# Patient Record
Sex: Male | Born: 1965 | Hispanic: No | Marital: Single | Smoking: Never smoker
Health system: Southern US, Community
[De-identification: ages and names within clinical notes are randomized; demographics above are authoritative.]

## PROBLEM LIST (undated history)

## (undated) DIAGNOSIS — Z992 Dependence on renal dialysis: Secondary | ICD-10-CM

## (undated) DIAGNOSIS — N186 End stage renal disease: Secondary | ICD-10-CM

## (undated) DIAGNOSIS — I639 Cerebral infarction, unspecified: Secondary | ICD-10-CM

## (undated) DIAGNOSIS — I5020 Unspecified systolic (congestive) heart failure: Secondary | ICD-10-CM

## (undated) DIAGNOSIS — I10 Essential (primary) hypertension: Secondary | ICD-10-CM

## (undated) DIAGNOSIS — E785 Hyperlipidemia, unspecified: Secondary | ICD-10-CM

## (undated) DIAGNOSIS — N189 Chronic kidney disease, unspecified: Secondary | ICD-10-CM

## (undated) HISTORY — DX: Hyperlipidemia, unspecified: E78.5

## (undated) HISTORY — DX: End stage renal disease: N18.6

## (undated) HISTORY — DX: Unspecified systolic (congestive) heart failure: I50.20

## (undated) HISTORY — DX: Dependence on renal dialysis: Z99.2

## (undated) HISTORY — DX: Essential (primary) hypertension: I10

## (undated) HISTORY — DX: Cerebral infarction, unspecified: I63.9

## (undated) HISTORY — DX: Chronic kidney disease, unspecified: N18.9

---

## 2021-12-17 ENCOUNTER — Emergency Department (HOSPITAL_COMMUNITY): Payer: Medicaid - Out of State

## 2021-12-17 ENCOUNTER — Other Ambulatory Visit: Payer: Self-pay

## 2021-12-17 ENCOUNTER — Emergency Department (HOSPITAL_COMMUNITY)
Admission: EM | Admit: 2021-12-17 | Discharge: 2021-12-17 | Disposition: A | Discharge status: Home / Self Care | Payer: Medicaid - Out of State | Attending: Emergency Medicine | Admitting: Emergency Medicine

## 2021-12-17 DIAGNOSIS — T82898A Other specified complication of vascular prosthetic devices, implants and grafts, initial encounter: Secondary | ICD-10-CM | POA: Diagnosis present

## 2021-12-17 DIAGNOSIS — R61 Generalized hyperhidrosis: Secondary | ICD-10-CM | POA: Diagnosis not present

## 2021-12-17 DIAGNOSIS — Z992 Dependence on renal dialysis: Secondary | ICD-10-CM | POA: Insufficient documentation

## 2021-12-17 DIAGNOSIS — E875 Hyperkalemia: Secondary | ICD-10-CM | POA: Diagnosis not present

## 2021-12-17 DIAGNOSIS — Y841 Kidney dialysis as the cause of abnormal reaction of the patient, or of later complication, without mention of misadventure at the time of the procedure: Secondary | ICD-10-CM | POA: Insufficient documentation

## 2021-12-17 DIAGNOSIS — N186 End stage renal disease: Secondary | ICD-10-CM | POA: Insufficient documentation

## 2021-12-17 HISTORY — PX: IR FLUORO GUIDE CV LINE RIGHT: IMG2283

## 2021-12-17 LAB — CBC WITH DIFFERENTIAL/PLATELET
Abs Immature Granulocytes: 0.03 10*3/uL (ref 0.00–0.07)
Basophils Absolute: 0.1 10*3/uL (ref 0.0–0.1)
Basophils Relative: 1 %
Eosinophils Absolute: 0.3 10*3/uL (ref 0.0–0.5)
Eosinophils Relative: 3 %
HCT: 34.7 % — ABNORMAL LOW (ref 39.0–52.0)
Hemoglobin: 11.3 g/dL — ABNORMAL LOW (ref 13.0–17.0)
Immature Granulocytes: 0 %
Lymphocytes Relative: 13 %
Lymphs Abs: 1.1 10*3/uL (ref 0.7–4.0)
MCH: 32.2 pg (ref 26.0–34.0)
MCHC: 32.6 g/dL (ref 30.0–36.0)
MCV: 98.9 fL (ref 80.0–100.0)
Monocytes Absolute: 0.3 10*3/uL (ref 0.1–1.0)
Monocytes Relative: 4 %
Neutro Abs: 6.6 10*3/uL (ref 1.7–7.7)
Neutrophils Relative %: 79 %
Platelets: 181 10*3/uL (ref 150–400)
RBC: 3.51 MIL/uL — ABNORMAL LOW (ref 4.22–5.81)
RDW: 12.1 % (ref 11.5–15.5)
WBC: 8.4 10*3/uL (ref 4.0–10.5)
nRBC: 0 % (ref 0.0–0.2)

## 2021-12-17 LAB — COMPREHENSIVE METABOLIC PANEL
ALT: 11 U/L (ref 0–44)
AST: 11 U/L — ABNORMAL LOW (ref 15–41)
Albumin: 4.1 g/dL (ref 3.5–5.0)
Alkaline Phosphatase: 50 U/L (ref 38–126)
Anion gap: 14 (ref 5–15)
BUN: 99 mg/dL — ABNORMAL HIGH (ref 6–20)
CO2: 19 mmol/L — ABNORMAL LOW (ref 22–32)
Calcium: 9 mg/dL (ref 8.9–10.3)
Chloride: 107 mmol/L (ref 98–111)
Creatinine, Ser: 14.89 mg/dL — ABNORMAL HIGH (ref 0.61–1.24)
GFR, Estimated: 3 mL/min — ABNORMAL LOW (ref >60)
Glucose, Bld: 103 mg/dL — ABNORMAL HIGH (ref 70–99)
Potassium: 5.3 mmol/L — ABNORMAL HIGH (ref 3.5–5.1)
Sodium: 140 mmol/L (ref 135–145)
Total Bilirubin: 0.7 mg/dL (ref 0.3–1.2)
Total Protein: 6.9 g/dL (ref 6.5–8.1)

## 2021-12-17 LAB — MAGNESIUM: Magnesium: 2.6 mg/dL — ABNORMAL HIGH (ref 1.7–2.4)

## 2021-12-17 MED ORDER — HEPARIN SODIUM (PORCINE) 1000 UNIT/ML IJ SOLN
INTRAMUSCULAR | Status: AC
  Filled 2021-12-17: qty 10

## 2021-12-17 MED ORDER — LIDOCAINE HCL (PF) 1 % IJ SOLN
INTRAMUSCULAR | Status: DC | PRN
Start: 2021-12-17 — End: 2021-12-17
  Administered 2021-12-17: 10 mL

## 2021-12-17 MED ORDER — CEFAZOLIN SODIUM-DEXTROSE 2-4 GM/100ML-% IV SOLN
2.0000 g | Freq: Once | INTRAVENOUS | Status: AC
  Administered 2021-12-17: 2 g via INTRAVENOUS
  Filled 2021-12-17: qty 100

## 2021-12-17 MED ORDER — LOKELMA 10 G PO PACK: 10.0000 g | PACK | Freq: Every day | ORAL | 0 refills | Status: DC

## 2021-12-17 MED ORDER — LIDOCAINE HCL 1 % IJ SOLN
INTRAMUSCULAR | Status: AC
  Filled 2021-12-17: qty 20

## 2021-12-17 MED ORDER — SODIUM ZIRCONIUM CYCLOSILICATE 10 G PO PACK
10.0000 g | PACK | Freq: Once | ORAL | Status: AC
  Administered 2021-12-17: 10 g via ORAL
  Filled 2021-12-17: qty 1

## 2021-12-17 NOTE — ED Notes (Signed)
Patient transported to IR 

## 2021-12-17 NOTE — ED Provider Notes (Incomplete)
°  [facility] EMERGENCY DEPARTMENT Provider Note   CSN: [id number] Arrival date & time: 12/17/21  1128     History {Add pertinent medical, surgical, social history, OB history to HPI:1} Chief Complaint  Patient presents with   Vascular Access Problem    Dialysis Cath    Scott Crosby is a 56 y.o. male.  HPI Patient presenting for concern for dialysis catheter.  He was told on Tuesday that he will need replacement.  He has missed Thursday and Saturday sessions of HD. Moved from [place] a month ago. Established w/ [facility].    Home Medications Prior to Admission medications   Not on File      Allergies    Patient has no known allergies.    Review of Systems   Review of Systems  Physical Exam Updated Vital Signs BP (!) 194/121 (BP Location: Right Arm)    Pulse 94    Temp 98 F (36.7 C) (Oral)    Resp 16    SpO2 100%  Physical Exam  ED Results / Procedures / Treatments   Labs (all labs ordered are listed, but only abnormal results are displayed) Labs Reviewed  COMPREHENSIVE METABOLIC PANEL - Abnormal; Notable for the following components:      Result Value   Potassium 5.3 (*)    CO2 19 (*)    Glucose, Bld 103 (*)    BUN 99 (*)    Creatinine, Ser 14.89 (*)    AST 11 (*)    GFR, Estimated 3 (*)    All other components within normal limits  CBC WITH DIFFERENTIAL/PLATELET - Abnormal; Notable for the following components:   RBC 3.51 (*)    Hemoglobin 11.3 (*)    HCT 34.7 (*)    All other components within normal limits  MAGNESIUM - Abnormal; Notable for the following components:   Magnesium 2.6 (*)    All other components within normal limits    EKG None  Radiology DG Chest 1 View  Result Date: 12/17/2021 CLINICAL DATA:  Missed dialysis EXAM: CHEST  1 VIEW COMPARISON:  None. FINDINGS: The heart size and mediastinal contours are within normal limits. Right neck large bore multi lumen vascular catheter, tips projecting near the  confluence of the superior vena cava. Both lungs are clear. The visualized skeletal structures are unremarkable. IMPRESSION: 1. No acute abnormality of the lungs. 2. Right neck large bore multi lumen vascular catheter, tips projecting near the confluence of the superior vena cava. Electronically Signed   By: Delanna Ahmadi M.D.   On: 12/17/2021 12:20    Procedures Procedures  {Document cardiac monitor, telemetry assessment procedure when appropriate:1}  Medications Ordered in ED Medications - No data to display  ED Course/ Medical Decision Making/ A&P                           Medical Decision Making  ***  {Document critical care time when appropriate:1} {Document review of labs and clinical decision tools ie heart score, Chads2Vasc2 etc:1}  {Document your independent review of radiology images, and any outside records:1} {Document your discussion with family members, caretakers, and with consultants:1} {Document social determinants of health affecting pt's care:1} {Document your decision making why or why not admission, treatments were needed:1} Final Clinical Impression(s) / ED Diagnoses Final diagnoses:  None    Rx / DC Orders ED Discharge Orders     None

## 2021-12-17 NOTE — Discharge Instructions (Addendum)
Return for any problem.  ? ?Take Lokelma (10 grams) once by mouth tomorrow morning prior to outpatient dialysis session.  ?

## 2021-12-17 NOTE — ED Triage Notes (Signed)
Pt here for problem with dialysis catheter. Pt has cath in R chest, dialysis staff told him they think it is "falling out", pt has missed last 2 sessions due to this. Pt usually goes Tue, Thurs, and Sat. Pt last went on Tuesday, has been limiting PO intake, denies pain. ?

## 2021-12-17 NOTE — ED Provider Triage Note (Signed)
Emergency Medicine Provider Triage Evaluation Note ? ?Scott Crosby , a 56 y.o. male  was evaluated in triage.  Pt states that his catheter for dialysis is out of place.  He was told that he needed a new 1 placed during his dialysis session on Tuesday.  He has not been able to do this.  Has not had dialysis since Tuesday, missed his Thursday and Saturday sessions.  Says that he has been limiting his fluid intake and has not had any symptoms of missed dialysis. ?Review of Systems  ?Positive: As above.  No symptoms ?Negative: Difficulty breathing, ascites, leg inflammation, chest pain ? ?Physical Exam  ?BP (!) 194/121 (BP Location: Right Arm)   Pulse 94   Temp 98 ?F (36.7 ?C) (Oral)   Resp 16   SpO2 100%  ?Gen:   Awake, no distress   ?Resp:  Normal effort  ?MSK:   Moves extremities without difficulty  ?Other:  Catheter appears to be in place, has not fallen out.  No signs of infection.  Patient well-appearing ? ?Medical Decision Making  ?Medically screening exam initiated at 11:42 AM.  Appropriate orders placed.  Arcangel Minion was informed that the remainder of the evaluation will be completed by another provider, this initial triage assessment does not replace that evaluation, and the importance of remaining in the ED until their evaluation is complete. ? ? ?  ?Rhae Hammock, PA-C ?12/17/21 1144 ? ?

## 2021-12-17 NOTE — Procedures (Signed)
Pre-procedure Diagnosis: ESRD ?Post-procedure Diagnosis: Same ? ?Successful replacement of tunneled HD catheter with tips terminating within the superior aspect of the right atrium.   ? ?Complications: None Immediate ?EBL: Trace  ? ?The catheter is ready for immediate use.  ? ?Ronny Bacon, MD ?Pager #: [phone] ? ? ?

## 2021-12-17 NOTE — ED Provider Notes (Signed)
Patient seen after prior EDP. ? ?Catheter changed without difficulty by IR. ? ?Patient with plan for outpatient dialysis tomorrow ? ?Patient is appropriate for discharge. ?  ?Valarie Merino, MD ?12/17/21 1732 ? ?

## 2021-12-18 ENCOUNTER — Other Ambulatory Visit (HOSPITAL_COMMUNITY): Payer: Self-pay | Admitting: Nephrology

## 2021-12-27 ENCOUNTER — Other Ambulatory Visit: Payer: Self-pay

## 2021-12-27 DIAGNOSIS — N189 Chronic kidney disease, unspecified: Secondary | ICD-10-CM

## 2021-12-31 ENCOUNTER — Other Ambulatory Visit: Payer: Self-pay

## 2021-12-31 ENCOUNTER — Ambulatory Visit (INDEPENDENT_AMBULATORY_CARE_PROVIDER_SITE_OTHER)
Admission: RE | Admit: 2021-12-31 | Discharge: 2021-12-31 | Disposition: A | Discharge status: Home / Self Care | Payer: Medicare Other | Source: Ambulatory Visit | Attending: Vascular Surgery | Admitting: Vascular Surgery

## 2021-12-31 ENCOUNTER — Ambulatory Visit (HOSPITAL_COMMUNITY)
Admission: RE | Admit: 2021-12-31 | Discharge: 2021-12-31 | Disposition: A | Discharge status: Home / Self Care | Payer: Medicare Other | Source: Ambulatory Visit | Attending: Vascular Surgery | Admitting: Vascular Surgery

## 2021-12-31 DIAGNOSIS — N189 Chronic kidney disease, unspecified: Secondary | ICD-10-CM

## 2022-01-09 ENCOUNTER — Encounter: Payer: Medicaid - Out of State | Admitting: Vascular Surgery

## 2022-01-22 ENCOUNTER — Encounter: Payer: Self-pay | Admitting: Vascular Surgery

## 2022-01-22 ENCOUNTER — Ambulatory Visit (INDEPENDENT_AMBULATORY_CARE_PROVIDER_SITE_OTHER): Payer: Medicaid Other | Admitting: Vascular Surgery

## 2022-01-22 VITALS — BP 159/109 | HR 108 | Temp 101.1°F | Resp 20

## 2022-01-22 DIAGNOSIS — N186 End stage renal disease: Secondary | ICD-10-CM

## 2022-01-22 DIAGNOSIS — Z992 Dependence on renal dialysis: Secondary | ICD-10-CM | POA: Diagnosis not present

## 2022-01-22 NOTE — Progress Notes (Signed)
[facility]  ? ?ASSESSMENT / PLAN: ?Scott Crosby is a 56 y.o. right handed male in need of permanent dialysis access. I reviewed options for dialysis in detail with the patient, including hemodialysis and peritoneal dialysis. I counseled the patient to ask their nephrologist about their candidacy for renal transplant. I counseled the patient that dialysis access requires surveillance and periodic maintenance. Plan to proceed with left brachiobasilic arteriovenous fistula as OR schedule allows. ? ?CHIEF COMPLAINT: ESRD in need of dialysis ? ?HISTORY OF PRESENT ILLNESS: ?Scott Crosby is a 56 y.o. male with a known history of end-stage renal disease on dialysis radiate previously dialyzed through a left upper extremity Cimino fistula created in [place].  This is thrombosed.  He now has a right IJ tunneled dialysis catheter.  He is right-handed. ? ?Past Medical History:  ?Diagnosis Date  ? Chronic kidney disease   ? Hyperlipidemia   ? Hypertension   ? ? ?Past Surgical History:  ?Procedure Laterality Date  ? IR FLUORO GUIDE CV LINE RIGHT  12/17/2021  ? ? ?History reviewed. No pertinent family history. ? ?Social History  ? ?Socioeconomic History  ? Marital status: Single  ?  Spouse name: Not on file  ? Number of children: Not on file  ? Years of education: Not on file  ? Highest education level: Not on file  ?Occupational History  ? Not on file  ?Tobacco Use  ? Smoking status: Never  ? Smokeless tobacco: Never  ?Vaping Use  ? Vaping Use: Never used  ?Substance and Sexual Activity  ? Alcohol use: Not on file  ? Drug use: Not Currently  ? Sexual activity: Not on file  ?Other Topics Concern  ? Not on file  ?Social History Narrative  ? Not on file  ? ?Social Determinants of Health  ? ?Financial Resource Strain: Not on file  ?Food Insecurity: Not on file  ?Transportation Needs: Not on file  ?Physical Activity: Not on file  ?Stress: Not on file  ?Social Connections: Not on file  ?Intimate  Partner Violence: Not on file  ? ? ?No Known Allergies ? ?Current Outpatient Medications  ?Medication Sig Dispense Refill  ? atorvastatin (LIPITOR) 10 MG tablet Take 10 mg by mouth daily.    ? B Complex-C-Folic Acid (B-COMPLEX/FOLIC ACID/VITAMIN C) TBCR Take 1 tablet by mouth daily.    ? calcium acetate (PHOSLO) 667 MG tablet Take by mouth.    ? losartan (COZAAR) 100 MG tablet Take 100 mg by mouth daily.    ? sodium zirconium cyclosilicate (LOKELMA) 10 g PACK packet Take 10 g by mouth daily. 1 packet 0  ? ?No current facility-administered medications for this visit.  ? ? ?PHYSICAL EXAM ?Vitals:  ? 01/22/22 1545  ?BP: (!) 159/109  ?Pulse: (!) 108  ?Resp: 20  ?Temp: (!) 101.1 ?F (38.4 ?C)  ?SpO2: 100%  ? ? ?Constitutional: Chronically ill-appearing male in no acute distress. ?Cardiac: regular rate and rhythm.  ?Respiratory:  unlabored. ?Abdominal:  soft, non-tender, non-distended.  ?Peripheral vascular: Scarring consistent with left upper extremity Cimino fistula.  This is thrombosed.  He has a 2+ radial pulse and a 2+ brachial pulse on the left. ? ?PERTINENT LABORATORY AND RADIOLOGIC DATA ? ?Most recent CBC ? ?  Latest Ref Rng & Units 12/17/2021  ? 11:45 AM  ?CBC  ?WBC 4.0 - 10.5 K/uL 8.4    ?Hemoglobin 13.0 - 17.0 g/dL 11.3    ?Hematocrit 39.0 - 52.0 % 34.7    ?Platelets  150 - 400 K/uL 181    ?  ? ?Most recent CMP ? ?  Latest Ref Rng & Units 12/17/2021  ? 11:45 AM  ?CMP  ?Glucose 70 - 99 mg/dL 103    ?BUN 6 - 20 mg/dL 99    ?Creatinine 0.61 - 1.24 mg/dL 14.89    ?Sodium 135 - 145 mmol/L 140    ?Potassium 3.5 - 5.1 mmol/L 5.3    ?Chloride 98 - 111 mmol/L 107    ?CO2 22 - 32 mmol/L 19    ?Calcium 8.9 - 10.3 mg/dL 9.0    ?Total Protein 6.5 - 8.1 g/dL 6.9    ?Total Bilirubin 0.3 - 1.2 mg/dL 0.7    ?Alkaline Phos 38 - 126 U/L 50    ?AST 15 - 41 U/L 11    ?ALT 0 - 44 U/L 11    ? ?Venous duplex shows generous left iliac vein suitable for fistula creation. ? ?Yevonne Aline. Stanford Breed, MD ?[facility]  [place] ?Office Phone Number: [phone] ?01/22/2022 5:07 PM ? ?Total time spent on preparing this encounter including chart review, data review, collecting history, examining the patient, coordinating care for this new patient, 60 minutes. ? ?Portions of this report may have been transcribed using voice recognition software.  Every effort has been made to ensure accuracy; however, inadvertent computerized transcription errors may still be present. ? ? ? ?

## 2022-01-22 NOTE — H&P (View-Only) (Signed)
[facility] ? ?ASSESSMENT / PLAN: ?Scott Crosby is a 56 y.o. right handed male in need of permanent dialysis access. I reviewed options for dialysis in detail with the patient, including hemodialysis and peritoneal dialysis. I counseled the patient to ask their nephrologist about their candidacy for renal transplant. I counseled the patient that dialysis access requires surveillance and periodic maintenance. Plan to proceed with left brachiobasilic arteriovenous fistula as OR schedule allows. ? ?CHIEF COMPLAINT: ESRD in need of dialysis ? ?HISTORY OF PRESENT ILLNESS: ?Scott Crosby is a 56 y.o. male with a known history of end-stage renal disease on dialysis radiate previously dialyzed through a left upper extremity Cimino fistula created in [place].  This is thrombosed.  He now has a right IJ tunneled dialysis catheter.  He is right-handed. ? ?Past Medical History:  ?Diagnosis Date  ? Chronic kidney disease   ? Hyperlipidemia   ? Hypertension   ? ? ?Past Surgical History:  ?Procedure Laterality Date  ? IR FLUORO GUIDE CV LINE RIGHT  12/17/2021  ? ? ?History reviewed. No pertinent family history. ? ?Social History  ? ?Socioeconomic History  ? Marital status: Single  ?  Spouse name: Not on file  ? Number of children: Not on file  ? Years of education: Not on file  ? Highest education level: Not on file  ?Occupational History  ? Not on file  ?Tobacco Use  ? Smoking status: Never  ? Smokeless tobacco: Never  ?Vaping Use  ? Vaping Use: Never used  ?Substance and Sexual Activity  ? Alcohol use: Not on file  ? Drug use: Not Currently  ? Sexual activity: Not on file  ?Other Topics Concern  ? Not on file  ?Social History Narrative  ? Not on file  ? ?Social Determinants of Health  ? ?Financial Resource Strain: Not on file  ?Food Insecurity: Not on file  ?Transportation Needs: Not on file  ?Physical Activity: Not on file  ?Stress: Not on file  ?Social Connections: Not on file  ?Intimate  Partner Violence: Not on file  ? ? ?No Known Allergies ? ?Current Outpatient Medications  ?Medication Sig Dispense Refill  ? atorvastatin (LIPITOR) 10 MG tablet Take 10 mg by mouth daily.    ? B Complex-C-Folic Acid (B-COMPLEX/FOLIC ACID/VITAMIN C) TBCR Take 1 tablet by mouth daily.    ? calcium acetate (PHOSLO) 667 MG tablet Take by mouth.    ? losartan (COZAAR) 100 MG tablet Take 100 mg by mouth daily.    ? sodium zirconium cyclosilicate (LOKELMA) 10 g PACK packet Take 10 g by mouth daily. 1 packet 0  ? ?No current facility-administered medications for this visit.  ? ? ?PHYSICAL EXAM ?Vitals:  ? 01/22/22 1545  ?BP: (!) 159/109  ?Pulse: (!) 108  ?Resp: 20  ?Temp: (!) 101.1 ?F (38.4 ?C)  ?SpO2: 100%  ? ? ?Constitutional: Chronically ill-appearing male in no acute distress. ?Cardiac: regular rate and rhythm.  ?Respiratory:  unlabored. ?Abdominal:  soft, non-tender, non-distended.  ?Peripheral vascular: Scarring consistent with left upper extremity Cimino fistula.  This is thrombosed.  He has a 2+ radial pulse and a 2+ brachial pulse on the left. ? ?PERTINENT LABORATORY AND RADIOLOGIC DATA ? ?Most recent CBC ? ?  Latest Ref Rng & Units 12/17/2021  ? 11:45 AM  ?CBC  ?WBC 4.0 - 10.5 K/uL 8.4    ?Hemoglobin 13.0 - 17.0 g/dL 11.3    ?Hematocrit 39.0 - 52.0 % 34.7    ?Platelets  150 - 400 K/uL 181    ?  ? ?Most recent CMP ? ?  Latest Ref Rng & Units 12/17/2021  ? 11:45 AM  ?CMP  ?Glucose 70 - 99 mg/dL 103    ?BUN 6 - 20 mg/dL 99    ?Creatinine 0.61 - 1.24 mg/dL 14.89    ?Sodium 135 - 145 mmol/L 140    ?Potassium 3.5 - 5.1 mmol/L 5.3    ?Chloride 98 - 111 mmol/L 107    ?CO2 22 - 32 mmol/L 19    ?Calcium 8.9 - 10.3 mg/dL 9.0    ?Total Protein 6.5 - 8.1 g/dL 6.9    ?Total Bilirubin 0.3 - 1.2 mg/dL 0.7    ?Alkaline Phos 38 - 126 U/L 50    ?AST 15 - 41 U/L 11    ?ALT 0 - 44 U/L 11    ? ?Venous duplex shows generous left iliac vein suitable for fistula creation. ? ?Yevonne Aline. Stanford Breed, MD ?[facility]  [place] ?Office Phone Number: [phone] ?01/22/2022 5:07 PM ? ?Total time spent on preparing this encounter including chart review, data review, collecting history, examining the patient, coordinating care for this new patient, 60 minutes. ? ?Portions of this report may have been transcribed using voice recognition software.  Every effort has been made to ensure accuracy; however, inadvertent computerized transcription errors may still be present. ? ? ? ?

## 2022-01-25 ENCOUNTER — Other Ambulatory Visit: Payer: Self-pay

## 2022-02-01 ENCOUNTER — Other Ambulatory Visit: Payer: Self-pay

## 2022-02-01 ENCOUNTER — Encounter (HOSPITAL_COMMUNITY): Payer: Self-pay | Admitting: Vascular Surgery

## 2022-02-01 NOTE — Progress Notes (Signed)
PCP - denies ?EKG - DOS ?Chest x-ray - 12/17/21 (1 view) ? ?------------- ? ?SDW INSTRUCTIONS: ? ?Your procedure is scheduled on Monday, May 1st. Please report to [facility] Main Entrance "A" at 5:30 A.M., and check in at the Admitting office. Call this number if you have problems the morning of surgery: [phone] ? ? ?Remember: Do not eat or drink after midnight the night before your surgery ? ?  ?Medications to take morning of surgery with a sip of water include: ?Atorvastatin (Lipitor) ? ? ?As of today, STOP taking any Aspirin (unless otherwise instructed by your surgeon), Aleve, Naproxen, Ibuprofen, Motrin, Advil, Goody's, BC's, all herbal medications, fish oil, and all vitamins. ? ?  ?The Morning of Surgery ?Do not wear jewelry ?Do not wear lotions, powders, colognes, or deodorant ?Do not bring valuables to the hospital. ?[facility] is not responsible for any belongings or valuables. ? ?If you are a smoker, DO NOT Smoke 24 hours prior to surgery ? ?If you wear a CPAP at night please bring your mask the morning of surgery  ? ?Remember that you must have someone to transport you home after your surgery, and remain with you for 24 hours if you are discharged the same day. ? ?Please bring cases for contacts, glasses, hearing aids, dentures or bridgework because it cannot be worn into surgery.  ? ?Patients discharged the day of surgery will not be allowed to drive home.  ? ?Please shower the NIGHT BEFORE/MORNING OF SURGERY (use antibacterial soap like DIAL soap if possible). Wear comfortable clothes the morning of surgery. Oral Hygiene is also important to reduce your risk of infection.  Remember - BRUSH YOUR TEETH THE MORNING OF SURGERY WITH YOUR REGULAR TOOTHPASTE ? ?Patient denies shortness of breath, fever, cough and chest pain.  ? ? ?   ? ?

## 2022-02-03 NOTE — Anesthesia Preprocedure Evaluation (Addendum)
Anesthesia Evaluation  ?Patient identified by MRN, date of birth, ID band ?Patient awake ? ? ? ?Reviewed: ?Allergy & Precautions, NPO status , Patient's Chart, lab work & pertinent test results ? ?Airway ?Mallampati: II ? ?TM Distance: >3 FB ?Neck ROM: Full ? ? ? Dental ? ?(+) Poor Dentition ?  ?Pulmonary ?neg pulmonary ROS,  ?  ?Pulmonary exam normal ? ? ? ? ? ? ? Cardiovascular ?hypertension, Pt. on medications ? ?Rhythm:Regular Rate:Normal ? ? ?  ?Neuro/Psych ?negative neurological ROS ? negative psych ROS  ? GI/Hepatic ?negative GI ROS, Neg liver ROS,   ?Endo/Other  ?negative endocrine ROS ? Renal/GU ?ESRF and DialysisRenal disease  ?negative genitourinary ?  ?Musculoskeletal ?negative musculoskeletal ROS ?(+)  ? Abdominal ?Normal abdominal exam  (+)   ?Peds ? Hematology ?negative hematology ROS ?(+)   ?Anesthesia Other Findings ? ? Reproductive/Obstetrics ? ?  ? ? ? ? ? ? ? ? ? ? ? ? ? ?  ?  ? ? ? ? ? ? ? ?Anesthesia Physical ?Anesthesia Plan ? ?ASA: 3 ? ?Anesthesia Plan: MAC and Regional  ? ?Post-op Pain Management: Regional block*  ? ?Induction: Intravenous ? ?PONV Risk Score and Plan: 1 and Ondansetron, Midazolam, Propofol infusion, Dexamethasone and Treatment may vary due to age or medical condition ? ?Airway Management Planned: Simple Face Mask, Natural Airway and Nasal Cannula ? ?Additional Equipment: None ? ?Intra-op Plan:  ? ?Post-operative Plan:  ? ?Informed Consent: I have reviewed the patients History and Physical, chart, labs and discussed the procedure including the risks, benefits and alternatives for the proposed anesthesia with the patient or authorized representative who has indicated his/her understanding and acceptance.  ? ? ? ?Dental advisory given ? ?Plan Discussed with: CRNA ? ?Anesthesia Plan Comments: (Lab Results ?     Component                Value               Date                 ?     NA                       135                 02/04/2022            ?     K                        5.0                 02/04/2022           ?     CO2                      19 (L)              12/17/2021           ?     GLUCOSE                  81                  02/04/2022           ?     BUN  53 (H)              02/04/2022           ?     CREATININE               10.00 (H)           02/04/2022           ?     CALCIUM                  9.0                 12/17/2021           ?     GFRNONAA                 3 (L)               12/17/2021           ?Lab Results ?     Component                Value               Date                 ?     WBC                      8.4                 12/17/2021           ?     HGB                      10.5 (L)            02/04/2022           ?     HCT                      31.0 (L)            02/04/2022           ?     MCV                      98.9                12/17/2021           ?     PLT                      181                 12/17/2021          )  ? ? ? ? ? ?Anesthesia Quick Evaluation ? ?

## 2022-02-04 ENCOUNTER — Ambulatory Visit (HOSPITAL_BASED_OUTPATIENT_CLINIC_OR_DEPARTMENT_OTHER): Payer: Medicare Other | Admitting: Anesthesiology

## 2022-02-04 ENCOUNTER — Ambulatory Visit (HOSPITAL_COMMUNITY)
Admission: RE | Admit: 2022-02-04 | Discharge: 2022-02-04 | Disposition: A | Discharge status: Home / Self Care | Payer: Medicare Other | Attending: Vascular Surgery | Admitting: Vascular Surgery

## 2022-02-04 ENCOUNTER — Other Ambulatory Visit: Payer: Self-pay

## 2022-02-04 ENCOUNTER — Encounter (HOSPITAL_COMMUNITY)
Admission: RE | Disposition: A | Discharge status: Home / Self Care | Payer: Self-pay | Source: Home / Self Care | Attending: Vascular Surgery

## 2022-02-04 ENCOUNTER — Ambulatory Visit (HOSPITAL_COMMUNITY): Payer: Medicare Other | Admitting: Anesthesiology

## 2022-02-04 DIAGNOSIS — N186 End stage renal disease: Secondary | ICD-10-CM | POA: Insufficient documentation

## 2022-02-04 DIAGNOSIS — Z992 Dependence on renal dialysis: Secondary | ICD-10-CM | POA: Insufficient documentation

## 2022-02-04 DIAGNOSIS — I120 Hypertensive chronic kidney disease with stage 5 chronic kidney disease or end stage renal disease: Secondary | ICD-10-CM

## 2022-02-04 DIAGNOSIS — N185 Chronic kidney disease, stage 5: Secondary | ICD-10-CM | POA: Diagnosis not present

## 2022-02-04 HISTORY — PX: AV FISTULA PLACEMENT: SHX1204

## 2022-02-04 LAB — POCT I-STAT, CHEM 8
BUN: 53 mg/dL — ABNORMAL HIGH (ref 6–20)
Calcium, Ion: 1.26 mmol/L (ref 1.15–1.40)
Chloride: 98 mmol/L (ref 98–111)
Creatinine, Ser: 10 mg/dL — ABNORMAL HIGH (ref 0.61–1.24)
Glucose, Bld: 81 mg/dL (ref 70–99)
HCT: 31 % — ABNORMAL LOW (ref 39.0–52.0)
Hemoglobin: 10.5 g/dL — ABNORMAL LOW (ref 13.0–17.0)
Potassium: 5 mmol/L (ref 3.5–5.1)
Sodium: 135 mmol/L (ref 135–145)
TCO2: 28 mmol/L (ref 22–32)

## 2022-02-04 SURGERY — ARTERIOVENOUS (AV) FISTULA CREATION
Anesthesia: Monitor Anesthesia Care | Site: Arm Upper | Laterality: Left

## 2022-02-04 MED ORDER — CHLORHEXIDINE GLUCONATE 0.12 % MT SOLN
OROMUCOSAL | Status: AC
  Administered 2022-02-04: 15 mL via OROMUCOSAL
  Filled 2022-02-04: qty 15

## 2022-02-04 MED ORDER — FENTANYL CITRATE (PF) 250 MCG/5ML IJ SOLN
INTRAMUSCULAR | Status: DC | PRN
Start: 2022-02-04 — End: 2022-02-04
  Administered 2022-02-04: 50 ug via INTRAVENOUS

## 2022-02-04 MED ORDER — FENTANYL CITRATE (PF) 100 MCG/2ML IJ SOLN
INTRAMUSCULAR | Status: AC
  Filled 2022-02-04: qty 2

## 2022-02-04 MED ORDER — CEFAZOLIN SODIUM-DEXTROSE 2-4 GM/100ML-% IV SOLN
INTRAVENOUS | Status: AC
  Filled 2022-02-04: qty 100

## 2022-02-04 MED ORDER — ORAL CARE MOUTH RINSE: 15.0000 mL | Freq: Once | OROMUCOSAL | Status: AC

## 2022-02-04 MED ORDER — HEPARIN 6000 UNIT IRRIGATION SOLUTION
Status: DC | PRN
  Administered 2022-02-04: 1

## 2022-02-04 MED ORDER — FENTANYL CITRATE (PF) 250 MCG/5ML IJ SOLN
INTRAMUSCULAR | Status: AC
  Filled 2022-02-04: qty 5

## 2022-02-04 MED ORDER — SODIUM CHLORIDE 0.9 % IV SOLN: INTRAVENOUS | Status: DC

## 2022-02-04 MED ORDER — MIDAZOLAM HCL 2 MG/2ML IJ SOLN
INTRAMUSCULAR | Status: AC
  Filled 2022-02-04: qty 2

## 2022-02-04 MED ORDER — HEPARIN 6000 UNIT IRRIGATION SOLUTION
Status: AC
  Filled 2022-02-04: qty 500

## 2022-02-04 MED ORDER — 0.9 % SODIUM CHLORIDE (POUR BTL) OPTIME
TOPICAL | Status: DC | PRN
  Administered 2022-02-04: 1000 mL

## 2022-02-04 MED ORDER — FENTANYL CITRATE (PF) 100 MCG/2ML IJ SOLN: 25.0000 ug | INTRAMUSCULAR | Status: DC | PRN

## 2022-02-04 MED ORDER — ROPIVACAINE HCL 5 MG/ML IJ SOLN
INTRAMUSCULAR | Status: DC | PRN
  Administered 2022-02-04: 10 mL

## 2022-02-04 MED ORDER — CEFAZOLIN SODIUM-DEXTROSE 2-4 GM/100ML-% IV SOLN
2.0000 g | INTRAVENOUS | Status: AC
  Administered 2022-02-04: 2 g via INTRAVENOUS

## 2022-02-04 MED ORDER — CHLORHEXIDINE GLUCONATE 4 % EX LIQD: 60.0000 mL | Freq: Once | CUTANEOUS | Status: DC

## 2022-02-04 MED ORDER — MEPIVACAINE HCL (PF) 2 % IJ SOLN
INTRAMUSCULAR | Status: DC | PRN
  Administered 2022-02-04: 20 mL

## 2022-02-04 MED ORDER — HYDROCODONE-ACETAMINOPHEN 5-325 MG PO TABS: 1.0000 | ORAL_TABLET | Freq: Four times a day (QID) | ORAL | 0 refills | Status: DC | PRN

## 2022-02-04 MED ORDER — CHLORHEXIDINE GLUCONATE 0.12 % MT SOLN: 15.0000 mL | Freq: Once | OROMUCOSAL | Status: AC

## 2022-02-04 MED ORDER — MIDAZOLAM HCL 2 MG/2ML IJ SOLN
INTRAMUSCULAR | Status: DC | PRN
Start: 2022-02-04 — End: 2022-02-04
  Administered 2022-02-04: 2 mg via INTRAVENOUS

## 2022-02-04 MED ORDER — HEPARIN SODIUM (PORCINE) 1000 UNIT/ML IJ SOLN
INTRAMUSCULAR | Status: DC | PRN
  Administered 2022-02-04: 5000 [IU] via INTRAVENOUS

## 2022-02-04 MED ORDER — PROPOFOL 500 MG/50ML IV EMUL
INTRAVENOUS | Status: DC | PRN
  Administered 2022-02-04: 50 ug/kg/min via INTRAVENOUS

## 2022-02-04 MED ORDER — ACETAMINOPHEN 10 MG/ML IV SOLN: 1000.0000 mg | Freq: Once | INTRAVENOUS | Status: DC | PRN

## 2022-02-04 MED ORDER — ONDANSETRON HCL 4 MG/2ML IJ SOLN
INTRAMUSCULAR | Status: DC | PRN
  Administered 2022-02-04: 4 mg via INTRAVENOUS

## 2022-02-04 MED ORDER — PROPOFOL 10 MG/ML IV BOLUS
INTRAVENOUS | Status: AC
  Filled 2022-02-04: qty 20

## 2022-02-04 MED ORDER — LIDOCAINE-EPINEPHRINE (PF) 1 %-1:200000 IJ SOLN
INTRAMUSCULAR | Status: AC
  Filled 2022-02-04: qty 30

## 2022-02-04 MED ORDER — PAPAVERINE HCL 30 MG/ML IJ SOLN
INTRAMUSCULAR | Status: AC
  Filled 2022-02-04: qty 2

## 2022-02-04 SURGICAL SUPPLY — 30 items
ARMBAND PINK RESTRICT EXTREMIT (MISCELLANEOUS) ×2 IMPLANT
CANISTER SUCT 3000ML PPV (MISCELLANEOUS) ×2 IMPLANT
CANNULA VESSEL 3MM 2 BLNT TIP (CANNULA) ×2 IMPLANT
CHLORAPREP W/TINT 26 (MISCELLANEOUS) ×2 IMPLANT
CLIP LIGATING EXTRA MED SLVR (CLIP) ×2 IMPLANT
CLIP LIGATING EXTRA SM BLUE (MISCELLANEOUS) ×2 IMPLANT
COVER PROBE W GEL 5X96 (DRAPES) ×2 IMPLANT
DERMABOND ADVANCED (GAUZE/BANDAGES/DRESSINGS) ×1
DERMABOND ADVANCED .7 DNX12 (GAUZE/BANDAGES/DRESSINGS) IMPLANT
ELECT REM PT RETURN 9FT ADLT (ELECTROSURGICAL) ×2
ELECTRODE REM PT RTRN 9FT ADLT (ELECTROSURGICAL) ×1 IMPLANT
GLOVE SURG SS PI 8.0 STRL IVOR (GLOVE) ×2 IMPLANT
GOWN STRL REUS W/ TWL LRG LVL3 (GOWN DISPOSABLE) ×2 IMPLANT
GOWN STRL REUS W/ TWL XL LVL3 (GOWN DISPOSABLE) ×1 IMPLANT
GOWN STRL REUS W/TWL LRG LVL3 (GOWN DISPOSABLE) ×4
GOWN STRL REUS W/TWL XL LVL3 (GOWN DISPOSABLE) ×2
KIT BASIN OR (CUSTOM PROCEDURE TRAY) ×2 IMPLANT
KIT TURNOVER KIT B (KITS) ×2 IMPLANT
NS IRRIG 1000ML POUR BTL (IV SOLUTION) ×2 IMPLANT
PACK CV ACCESS (CUSTOM PROCEDURE TRAY) ×2 IMPLANT
PAD ARMBOARD 7.5X6 YLW CONV (MISCELLANEOUS) ×4 IMPLANT
SLING ARM FOAM STRAP LRG (SOFTGOODS) ×1 IMPLANT
SLING ARM FOAM STRAP MED (SOFTGOODS) IMPLANT
SUT MNCRL AB 4-0 PS2 18 (SUTURE) ×2 IMPLANT
SUT PROLENE 6 0 BV (SUTURE) ×3 IMPLANT
SUT VIC AB 3-0 SH 27 (SUTURE) ×2
SUT VIC AB 3-0 SH 27X BRD (SUTURE) ×1 IMPLANT
TOWEL GREEN STERILE (TOWEL DISPOSABLE) ×2 IMPLANT
UNDERPAD 30X36 HEAVY ABSORB (UNDERPADS AND DIAPERS) ×2 IMPLANT
WATER STERILE IRR 1000ML POUR (IV SOLUTION) ×2 IMPLANT

## 2022-02-04 NOTE — Anesthesia Postprocedure Evaluation (Signed)
Anesthesia Post Note ? ?Patient: Scott Crosby ? ?Procedure(s) Performed: LEFT BRACHIOCEPHALIC VEIN FISTULA CREATION (Left: Arm Upper) ? ?  ? ?Patient location during evaluation: PACU ?Anesthesia Type: Regional and MAC ?Level of consciousness: awake and alert ?Pain management: pain level controlled ?Vital Signs Assessment: post-procedure vital signs reviewed and stable ?Respiratory status: spontaneous breathing, nonlabored ventilation, respiratory function stable and patient connected to nasal cannula oxygen ?Cardiovascular status: stable and blood pressure returned to baseline ?Postop Assessment: no apparent nausea or vomiting ?Anesthetic complications: no ? ? ?No notable events documented. ? ?Last Vitals:  ?Vitals:  ? 02/04/22 0915 02/04/22 0930  ?BP: (!) 165/95 (!) 163/96  ?Pulse: 64 68  ?Resp: 13 17  ?Temp: (!) 36.4 ?C   ?SpO2: 100% 100%  ?  ?Last Pain:  ?Vitals:  ? 02/04/22 0930  ?TempSrc:   ?PainSc: 0-No pain  ? ? ?  ?  ?  ?  ?  ?  ? ?Scott Crosby ? ? ? ? ?

## 2022-02-04 NOTE — Discharge Instructions (Signed)
° °  [facility] [place] ° °Discharge Instructions ° °AV Fistula or Graft Surgery for Dialysis Access ° °Please refer to the following instructions for your post-procedure care. Your surgeon or physician assistant will discuss any changes with you. ° °Activity ° °You may drive the day following your surgery, if you are comfortable and no longer taking prescription pain medication. Resume full activity as the soreness in your incision resolves. ° °Bathing/Showering ° °You may shower after you go home. Keep your incision dry for 48 hours. Do not soak in a bathtub, hot tub, or swim until the incision heals completely. You may not shower if you have a hemodialysis catheter. ° °Incision Care ° °Clean your incision with mild soap and water after 48 hours. Pat the area dry with a clean towel. You do not need a bandage unless otherwise instructed. Do not apply any ointments or creams to your incision. You may have skin glue on your incision. Do not peel it off. It will come off on its own in about one week. Your arm may swell a bit after surgery. To reduce swelling use pillows to elevate your arm so it is above your heart. Your doctor will tell you if you need to lightly wrap your arm with an ACE bandage. ° °Diet ° °Resume your normal diet. There are not special food restrictions following this procedure. In order to heal from your surgery, it is CRITICAL to get adequate nutrition. Your body requires vitamins, minerals, and protein. Vegetables are the best source of vitamins and minerals. Vegetables also provide the perfect balance of protein. Processed food has little nutritional value, so try to avoid this. ° °Medications ° °Resume taking all of your medications. If your incision is causing pain, you may take over-the counter pain relievers such as acetaminophen (Tylenol). If you were prescribed a stronger pain medication, please be aware these medications can cause nausea and constipation. Prevent  nausea by taking the medication with a snack or meal. Avoid constipation by drinking plenty of fluids and eating foods with high amount of fiber, such as fruits, vegetables, and grains. Do not take Tylenol if you are taking prescription pain medications. ° ° ° ° °Follow up °Your surgeon may want to see you in the office following your access surgery. If so, this will be arranged at the time of your surgery. ° °Please call us immediately for any of the following conditions: ° °Increased pain, redness, drainage (pus) from your incision site °Fever of 101 degrees or higher °Severe or worsening pain at your incision site °Hand pain or numbness. ° °Reduce your risk of vascular disease: ° °Stop smoking. If you would like help, call [facility] at 1-800-QUIT-NOW (1-800-784-8669) or [facility] at 336-586-4000 ° °Manage your cholesterol °Maintain a desired weight °Control your diabetes °Keep your blood pressure down ° °Dialysis ° °It will take several weeks to several months for your new dialysis access to be ready for use. Your surgeon will determine when it is OK to use it. Your nephrologist will continue to direct your dialysis. You can continue to use your Permcath until your new access is ready for use. ° °If you have any questions, please call the office at [phone]. ° °

## 2022-02-04 NOTE — Op Note (Signed)
DATE OF SERVICE: 02/04/2022 ? ?PATIENT:  Scott Crosby  56 y.o. male ? ?PRE-OPERATIVE DIAGNOSIS:  ESRD ? ?POST-OPERATIVE DIAGNOSIS:  Same ? ?PROCEDURE:   ?Left brachiocephalic arteriovenous fistula creation ? ?SURGEON:  Surgeon(s) and Role: ?   * [name], MD - Primary ? ?ASSISTANT: Arlee Muslim, PA-C ? ?An experienced assistant was required given the complexity of this procedure and the standard of surgical care. My assistant helped with exposure through counter tension, suctioning, ligation and retraction to better visualize the surgical field.  My assistant expedited sewing during the case by following my sutures. Wherever I use the term "we" in the report, my assistant actively helped me with that portion of the procedure. ? ?ANESTHESIA:   regional and MAC ? ?EBL: minimal ? ?BLOOD ADMINISTERED:none ? ?DRAINS: none  ? ?LOCAL MEDICATIONS USED: NONE ? ?SPECIMEN:  none ? ?COUNTS: confirmed correct. ? ?TOURNIQUET:  none ? ?PATIENT DISPOSITION:  PACU - hemodynamically stable. ?  ?Delay start of Pharmacological VTE agent (>24hrs) due to surgical blood loss or risk of bleeding: no ? ?INDICATION FOR PROCEDURE: Scott Crosby is a 56 y.o. male with ESRD in need of permanent dialysis access. After careful discussion of risks, benefits, and alternatives the patient was offered creation of arteriovenous fistula. The patient understood and wished to proceed. ? ?OPERATIVE FINDINGS: Left cephalic vein of higher quality than anticipated. Unremarkable brachiocephalic arteriovenous fistula. Good thrill at completion. Palpable radial pulse at completion. ? ?DESCRIPTION OF PROCEDURE: After identification of the patient in the pre-operative holding area, the patient was transferred to the operating room. The patient was positioned supine on the operating room table. Anesthesia was induced. The left arm was prepped and draped in standard fashion. A surgical pause was performed confirming correct patient, procedure, and  operative location. ? ?Using intraoperative ultrasound, the course of the left upper extremity superficial veins was mapped.  The cephalic vein appeared adequate for arteriovenous fistula creation.  The brachial artery was similarly mapped.  The artery appeared adequate for arterial venous creation. We ensured there was no anomalous arterial anatomy such as a high bifurcation. ? ?A transverse incision was made in the left arm just distal to the antecubital crease.  Incision was carried down through subcutaneous tissue until the cephalic vein was identified and skeletonized.  We continued our exposure through the aponeurosis of the biceps.  The brachial artery was encountered its usual position.  The artery was circumferentially exposed and encircled with Silastic Vesseloops. ? ?Patient was systemically heparinized.  The distal cephalic vein was transected.  The distal stump of the cephalic vein was oversewn with a 2-0 silk suture.  The proximal vein was controlled with a bulldog clamp.  The brachial artery was clamped proximally medially.  An anterior arteriotomy was made with a 11 blade.  The arteriotomy was extended with Potts scissors.  Using a parachute technique the cephalic vein was anastomosed to the brachial arteriotomy in end-to-side fashion with continuous running suture of 6-0 Prolene.  Immediately prior to completion the anastomosis was flushed and de-aired.  The anastomosis was completed.  Hemostasis was assured. ? ?The fistula was interrogated with Doppler.  Audible bruit was heard throughout the course of the cephalic vein.  A radial artery signal was heard which augmented slightly with compression of the fistula. ? ?Upon completion of the case instrument and sharps counts were confirmed correct. The patient was transferred to the PACU in good condition. I was present for all portions of the procedure. ? ?Yevonne Aline. Stanford Breed,  MD ?[facility] [place] ?Office Phone Number: [phone] ?02/04/2022 9:02 AM ? ? ? ?

## 2022-02-04 NOTE — Anesthesia Procedure Notes (Signed)
?  Anesthesia Regional Block: Supraclavicular block  ? ?Pre-Anesthetic Checklist: , timeout performed,  Correct Patient, Correct Site, Correct Laterality,  Correct Procedure, Correct Position, site marked,  Risks and benefits discussed,  Surgical consent,  Pre-op evaluation,  At surgeon's request and post-op pain management ? ?Laterality: Left ? ?Prep: Dura Prep     ?  ?Needles:  ?Injection technique: Single-shot ? ?Needle Type: Echogenic Stimulator Needle   ? ? ?Needle Length: 5cm  ?Needle Gauge: 20  ? ? ? ?Additional Needles: ? ? ?Procedures:,,,, ultrasound used (permanent image in chart),,    ?Narrative:  ?Start time: 02/04/2022 7:07 AM ?End time: 02/04/2022 7:12 AM ?Injection made incrementally with aspirations every 5 mL. ? ?Performed by: Personally  ?Anesthesiologist: Darral Dash, DO ? ?Additional Notes: ?Patient identified. Risks/Benefits/Options discussed with patient including but not limited to bleeding, infection, nerve damage, failed block, incomplete pain control. Patient expressed understanding and wished to proceed. All questions were answered. Sterile technique was used throughout the entire procedure. Please see nursing notes for vital signs. Aspirated in 5cc intervals with injection for negative confirmation. Patient was given instructions on fall risk and not to get out of bed. All questions and concerns addressed with instructions to call with any issues or inadequate analgesia.   ?  ? ? ? ?

## 2022-02-04 NOTE — Transfer of Care (Signed)
Immediate Anesthesia Transfer of Care Note ? ?Patient: Scott Crosby ? ?Procedure(s) Performed: LEFT BRACHIOCEPHALIC VEIN FISTULA CREATION (Left: Arm Upper) ? ?Patient Location: PACU ? ?Anesthesia Type:General ? ?Level of Consciousness: drowsy ? ?Airway & Oxygen Therapy: Patient Spontanous Breathing and Patient connected to face mask oxygen ? ?Post-op Assessment: Report given to RN and Post -op Vital signs reviewed and stable ? ?Post vital signs: Reviewed and stable ? ?Last Vitals:  ?Vitals Value Taken Time  ?BP 165/95 02/04/22 0912  ?Temp    ?Pulse 69 02/04/22 0912  ?Resp 4 02/04/22 0912  ?SpO2 100 % 02/04/22 0912  ?Vitals shown include unvalidated device data. ? ?Last Pain:  ?Vitals:  ? 02/04/22 0633  ?TempSrc: Oral  ?PainSc:   ?   ? ?  ? ?Complications: No notable events documented. ?

## 2022-02-04 NOTE — Interval H&P Note (Signed)
History and Physical Interval Note: ? ?02/04/2022 ?7:27 AM ? ?Scott Crosby  has presented today for surgery, with the diagnosis of END STAGE RENAL DISEASE.  The various methods of treatment have been discussed with the patient and family. After consideration of risks, benefits and other options for treatment, the patient has consented to  Procedure(s) with comments: ?LEFT FIRST STAGE BASILIC VEIN FISTULA CREATION (Left) - PERIPHERAL NERVE BLOCK as a surgical intervention.  The patient's history has been reviewed, patient examined, no change in status, stable for surgery.  I have reviewed the patient's chart and labs.  Questions were answered to the patient's satisfaction.   ? ? ?[name] ? ? ?

## 2022-02-05 ENCOUNTER — Encounter (HOSPITAL_COMMUNITY): Payer: Self-pay | Admitting: Vascular Surgery

## 2022-02-13 ENCOUNTER — Other Ambulatory Visit: Payer: Self-pay

## 2022-02-13 DIAGNOSIS — N186 End stage renal disease: Secondary | ICD-10-CM

## 2022-03-12 ENCOUNTER — Ambulatory Visit (HOSPITAL_COMMUNITY)
Admission: RE | Admit: 2022-03-12 | Discharge: 2022-03-12 | Disposition: A | Discharge status: Home / Self Care | Payer: Medicare Other | Source: Ambulatory Visit | Attending: Vascular Surgery | Admitting: Vascular Surgery

## 2022-03-12 ENCOUNTER — Encounter: Payer: Medicaid Other | Admitting: Vascular Surgery

## 2022-03-12 DIAGNOSIS — N186 End stage renal disease: Secondary | ICD-10-CM | POA: Diagnosis not present

## 2022-03-12 DIAGNOSIS — Z992 Dependence on renal dialysis: Secondary | ICD-10-CM | POA: Diagnosis not present

## 2022-03-18 NOTE — Progress Notes (Signed)
[facility] [place]  ASSESSMENT / PLAN: Scott Crosby is a 56 y.o. status post left brachiocephalic arteriovenous fistula 02/04/22. This has matured nicely. He may begin using the fistula for dialysis at any point. Once the fistula is being used, the dialysis catheter can be removed. He can follow up with me as needed.  CHIEF COMPLAINT: ESRD in need of dialysis  HISTORY OF PRESENT ILLNESS: Scott Crosby is a 56 y.o. male with a known history of end-stage renal disease on dialysis radiate previously dialyzed through a left upper extremity Cimino fistula created in [place].  This is thrombosed.  He now has a right IJ tunneled dialysis catheter.  He is right-handed.  03/18/22: Doing well. Healed from surgery.   Past Medical History:  Diagnosis Date   Chronic kidney disease    Hyperlipidemia    Hypertension     Past Surgical History:  Procedure Laterality Date   AV FISTULA PLACEMENT Left 02/04/2022   Procedure: LEFT BRACHIOCEPHALIC VEIN FISTULA CREATION;  Surgeon: [name], MD;  Location: [facility] OR;  Service: Vascular;  Laterality: Left;  PERIPHERAL NERVE BLOCK   IR FLUORO GUIDE CV LINE RIGHT  12/17/2021    History reviewed. No pertinent family history.  Social History   Socioeconomic History   Marital status: Single    Spouse name: Not on file   Number of children: Not on file   Years of education: Not on file   Highest education level: Not on file  Occupational History   Not on file  Tobacco Use   Smoking status: Never   Smokeless tobacco: Never  Vaping Use   Vaping Use: Never used  Substance and Sexual Activity   Alcohol use: Not Currently   Drug use: Not Currently   Sexual activity: Not on file  Other Topics Concern   Not on file  Social History Narrative   Not on file   Social Determinants of Health   Financial Resource Strain: Not on file  Food Insecurity: Not on file  Transportation Needs: Not on file  Physical Activity: Not on file   Stress: Not on file  Social Connections: Not on file  Intimate Partner Violence: Not on file    No Known Allergies  Current Outpatient Medications  Medication Sig Dispense Refill   atorvastatin (LIPITOR) 10 MG tablet Take 10 mg by mouth daily.     B Complex-C-Folic Acid (DIALYVITE 700) 0.8 MG TABS Take 1 tablet by mouth daily.     calcium acetate (PHOSLO) 667 MG tablet Take 1,334 mg by mouth 3 (three) times daily with meals.     HYDROcodone-acetaminophen (NORCO) 5-325 MG tablet Take 1 tablet by mouth every 6 (six) hours as needed for moderate pain. 10 tablet 0   losartan (COZAAR) 100 MG tablet Take 100 mg by mouth daily.     sodium zirconium cyclosilicate (LOKELMA) 10 g PACK packet Take 10 g by mouth daily. (Patient not taking: Reported on 03/19/2022) 1 packet 0   No current facility-administered medications for this visit.    PHYSICAL EXAM Vitals:   03/19/22 1307  BP: (!) 172/107  Pulse: 94  Resp: 20  Temp: 98.3 F (36.8 C)  SpO2: 98%  Weight: 205 lb (93 kg)  Height: 5\' 10"  (1.778 m)     Constitutional: Chronically ill-appearing male in no acute distress. Cardiac: regular rate and rhythm.  Respiratory:  unlabored. Abdominal:  soft, non-tender, non-distended.  Peripheral vascular: Scarring consistent with left upper extremity Cimino fistula.  This is thrombosed.  He has a 2+ radial pulse and a 2+ brachial pulse on the left. Strong thrill in left arm brachiocephalic arteriovenous fistula.  PERTINENT LABORATORY AND RADIOLOGIC DATA  Most recent CBC    Latest Ref Rng & Units 02/04/2022    6:12 AM 12/17/2021   11:45 AM  CBC  WBC 4.0 - 10.5 K/uL  8.4   Hemoglobin 13.0 - 17.0 g/dL 10.5  11.3   Hematocrit 39.0 - 52.0 % 31.0  34.7   Platelets 150 - 400 K/uL  181      Most recent CMP    Latest Ref Rng & Units 02/04/2022    6:12 AM 12/17/2021   11:45 AM  CMP  Glucose 70 - 99 mg/dL 81  103   BUN 6 - 20 mg/dL 53  99   Creatinine 0.61 - 1.24 mg/dL 10.00  14.89   Sodium 135  - 145 mmol/L 135  140   Potassium 3.5 - 5.1 mmol/L 5.0  5.3   Chloride 98 - 111 mmol/L 98  107   CO2 22 - 32 mmol/L  19   Calcium 8.9 - 10.3 mg/dL  9.0   Total Protein 6.5 - 8.1 g/dL  6.9   Total Bilirubin 0.3 - 1.2 mg/dL  0.7   Alkaline Phos 38 - 126 U/L  50   AST 15 - 41 U/L  11   ALT 0 - 44 U/L  11    AVF duplex shows good technical result from AVF creation.  Scott Crosby. Scott Breed, MD [facility] Phone Number: [phone] 03/19/2022 1:21 PM  Portions of this report may have been transcribed using voice recognition software.  Every effort has been made to ensure accuracy; however, inadvertent computerized transcription errors may still be present.

## 2022-03-19 ENCOUNTER — Encounter: Payer: Self-pay | Admitting: Vascular Surgery

## 2022-03-19 ENCOUNTER — Ambulatory Visit (INDEPENDENT_AMBULATORY_CARE_PROVIDER_SITE_OTHER): Payer: Medicaid Other | Admitting: Vascular Surgery

## 2022-03-19 VITALS — BP 172/107 | HR 94 | Temp 98.3°F | Resp 20 | Ht 70.0 in | Wt 205.0 lb

## 2022-03-19 DIAGNOSIS — N186 End stage renal disease: Secondary | ICD-10-CM

## 2022-03-19 DIAGNOSIS — Z992 Dependence on renal dialysis: Secondary | ICD-10-CM

## 2022-12-18 IMAGING — CR DG CHEST 1V
1 series · 1 of 1 positions shown · non-contrast
Comparison: None.

CLINICAL DATA: Missed dialysis

EXAM:
CHEST  1 VIEW

[chest pa]
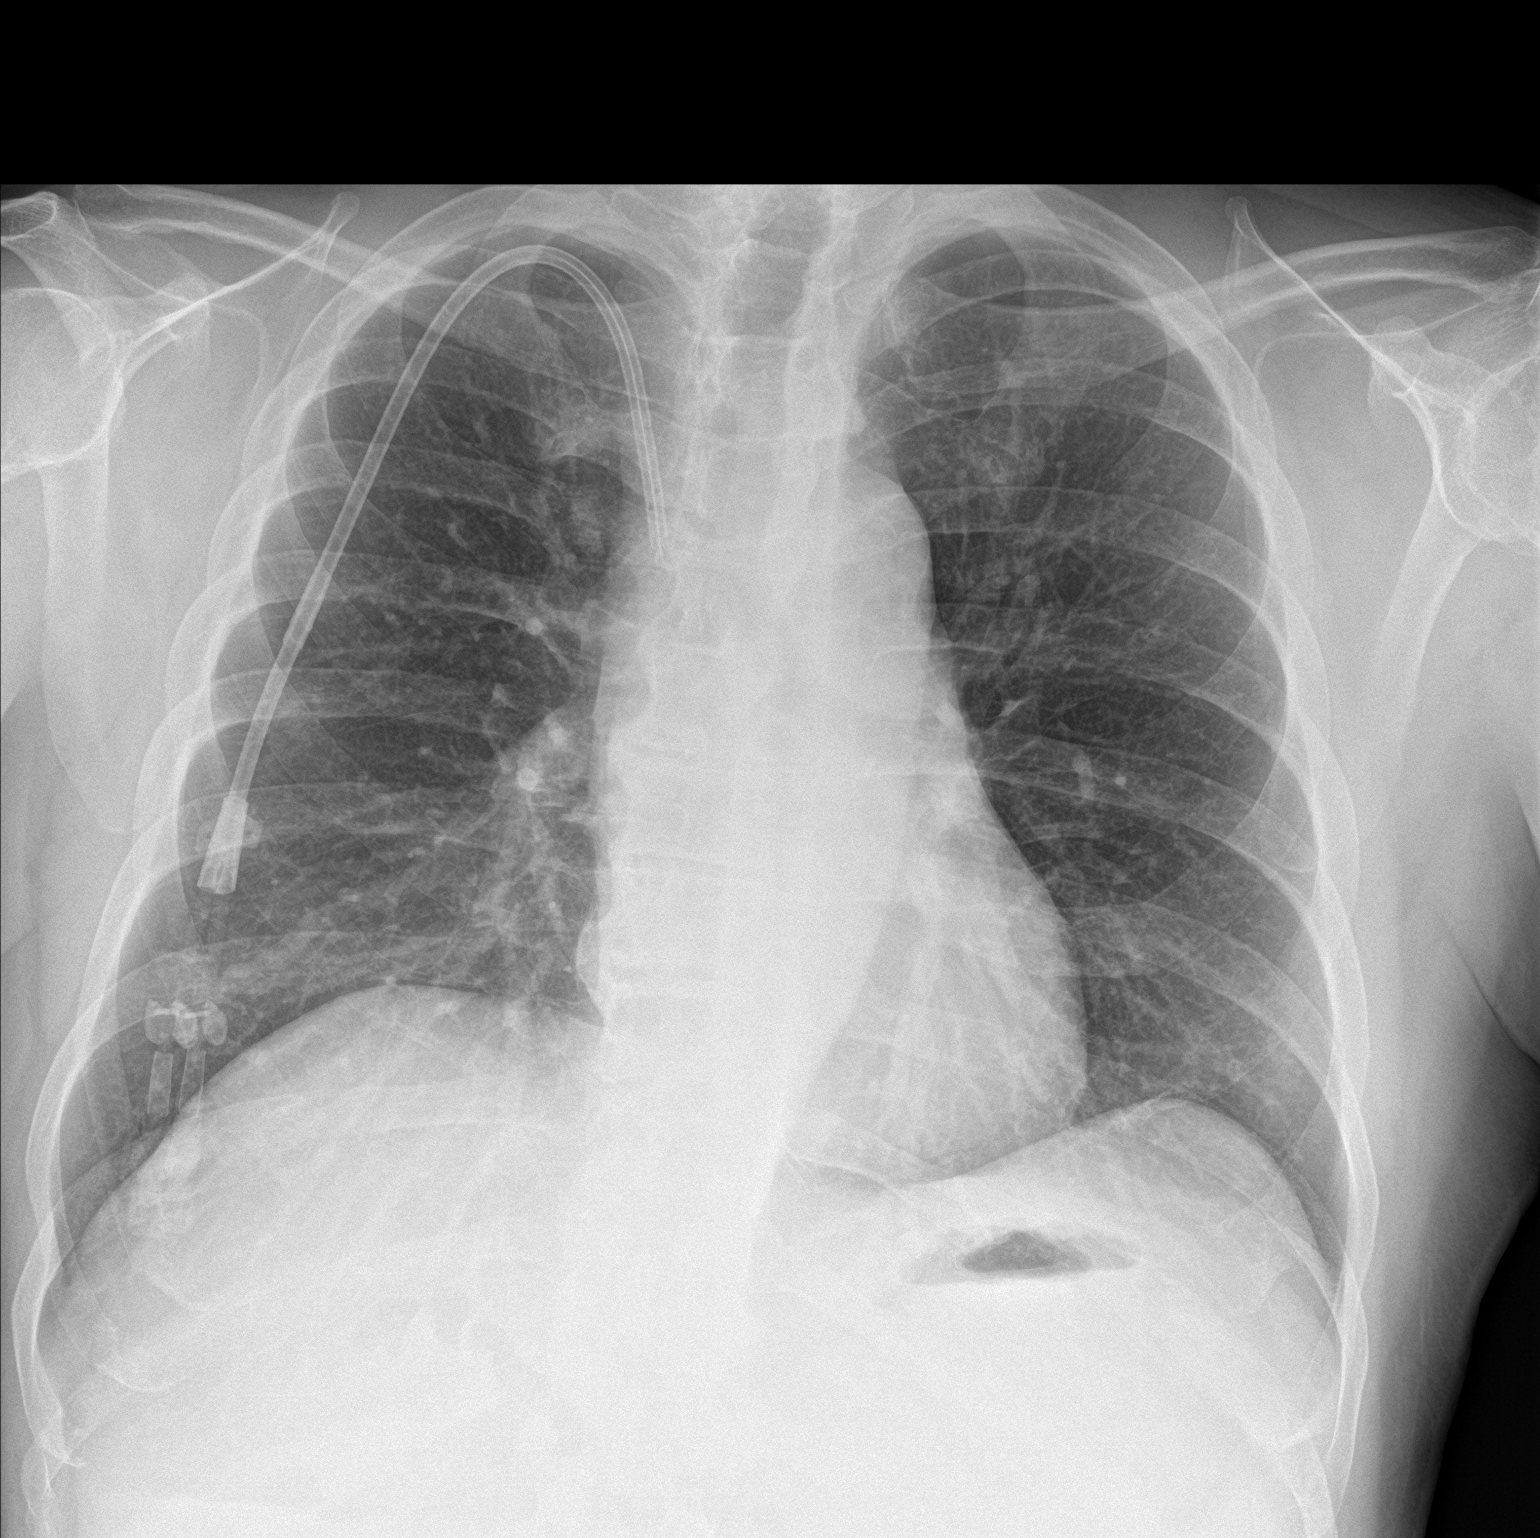

[1 of 1 positions shown; findings below may reference images not displayed]

FINDINGS: The heart size and mediastinal contours are within normal limits.
Right neck large bore multi lumen vascular catheter, tips projecting
near the confluence of the superior vena cava. Both lungs are clear.
The visualized skeletal structures are unremarkable.
IMPRESSION: 1. No acute abnormality of the lungs.
2. Right neck large bore multi lumen vascular catheter, tips
projecting near the confluence of the superior vena cava.

## 2023-01-02 ENCOUNTER — Encounter: Payer: Self-pay | Admitting: Nurse Practitioner

## 2023-02-17 ENCOUNTER — Ambulatory Visit: Payer: Medicare Other | Admitting: Nurse Practitioner

## 2023-04-09 ENCOUNTER — Ambulatory Visit (INDEPENDENT_AMBULATORY_CARE_PROVIDER_SITE_OTHER): Payer: 59 | Admitting: Physician Assistant

## 2023-04-09 ENCOUNTER — Encounter: Payer: Self-pay | Admitting: Physician Assistant

## 2023-04-09 VITALS — BP 112/74 | HR 96 | Ht 70.25 in | Wt 203.5 lb

## 2023-04-09 DIAGNOSIS — I639 Cerebral infarction, unspecified: Secondary | ICD-10-CM | POA: Diagnosis not present

## 2023-04-09 DIAGNOSIS — N186 End stage renal disease: Secondary | ICD-10-CM | POA: Diagnosis not present

## 2023-04-09 DIAGNOSIS — Z1211 Encounter for screening for malignant neoplasm of colon: Secondary | ICD-10-CM

## 2023-04-09 DIAGNOSIS — Z992 Dependence on renal dialysis: Secondary | ICD-10-CM

## 2023-04-09 DIAGNOSIS — I10 Essential (primary) hypertension: Secondary | ICD-10-CM | POA: Diagnosis not present

## 2023-04-09 NOTE — Patient Instructions (Signed)
_______________________________________________________  If your blood pressure at your visit was 140/90 or greater, please contact your primary care physician to follow up on this.  If you are age 59 or younger, your body mass index should be between 19-25. Your Body mass index is 28.99 kg/m. If this is out of the aformentioned range listed, please consider follow up with your Primary Care Provider.  ________________________________________________________  The [facility] providers would like to encourage you to use [facility] to communicate with providers for non-urgent requests or questions.  Due to long hold times on the telephone, sending your provider a message by [facility] may be a faster and more efficient way to get a response.  Please allow 48 business hours for a response.  Please remember that this is for non-urgent requests.  _______________________________________________________  Bonita Quin have been scheduled for a colonoscopy. Please follow written instructions given to you at your visit today.   Please pick up your prep supplies at the pharmacy within the next 1-3 days.  If you use inhalers (even only as needed), please bring them with you on the day of your procedure.  DO NOT TAKE 7 DAYS PRIOR TO TEST- Trulicity (dulaglutide) Ozempic, Wegovy (semaglutide) Mounjaro (tirzepatide) Bydureon Bcise (exanatide extended release)  DO NOT TAKE 1 DAY PRIOR TO YOUR TEST Rybelsus (semaglutide) Adlyxin (lixisenatide) Victoza (liraglutide) Byetta (exanatide) ___________________________________________________________________________  Due to recent changes in healthcare laws, you may see the results of your imaging and laboratory studies on MyChart before your provider has had a chance to review them.  We understand that in some cases there may be results that are confusing or concerning to you. Not all laboratory results come back in the same time frame and the provider may be waiting for  multiple results in order to interpret others.  Please give [place] 48 hours in order for your provider to thoroughly review all the results before contacting the office for clarification of your results.   Thank you for entrusting me with your care and choosing [facility].  Amy Esterwood, PA-C

## 2023-04-09 NOTE — Progress Notes (Signed)
Subjective:    Patient ID: Scott Crosby, male    DOB: 12/05/1965, 57 y.o.   MRN: [id number]  HPI Scott Crosby is a pleasant 57 year old male, new to GI today, furred by his PCP Dr. Festus Crosby for screening colonoscopy.  Patient has not had any prior GI evaluation. He does have history of end-stage renal disease, and is on dialysis Tuesday Thursdays and Saturdays.  He has history of hypertension and a prior CVA.  He has also undergone initial renal transplant evaluation and needs to have colonoscopy from that standpoint as well. He has no current GI complaints, specifically no complaints of abdominal pain, changes in bowel habits, melena or hematochezia. Family history is negative for GI disease as far as they are aware. Patient is not on anticoagulation, no known coronary artery disease, no oxygen use..  Review of Systems Pertinent positive and negative review of systems were noted in the above HPI section.  All other review of systems was otherwise negative.   Outpatient Encounter Medications as of 04/09/2023  Medication Sig   atorvastatin (LIPITOR) 10 MG tablet Take 10 mg by mouth daily.   B Complex-C-Folic Acid (DIALYVITE 800) 0.8 MG TABS Take 1 tablet by mouth daily.   calcium acetate (PHOSLO) 667 MG tablet Take 1,334 mg by mouth 3 (three) times daily with meals.   losartan (COZAAR) 100 MG tablet Take 100 mg by mouth daily.   sucroferric oxyhydroxide (VELPHORO) 500 MG chewable tablet Chew 500 mg by mouth 3 (three) times daily with meals.   [DISCONTINUED] HYDROcodone-acetaminophen (NORCO) 5-325 MG tablet Take 1 tablet by mouth every 6 (six) hours as needed for moderate pain. (Patient not taking: Reported on 04/07/2023)   [DISCONTINUED] sodium zirconium cyclosilicate (LOKELMA) 10 g PACK packet Take 10 g by mouth daily. (Patient not taking: Reported on 03/19/2022)   No facility-administered encounter medications on file as of 04/09/2023.   No Known Allergies There are no problems to display for  this patient.  Social History   Socioeconomic History   Marital status: Single    Spouse name: Not on file   Number of children: 0   Years of education: Not on file   Highest education level: Not on file  Occupational History   Occupation: disabled  Tobacco Use   Smoking status: Never   Smokeless tobacco: Never  Vaping Use   Vaping Use: Never used  Substance and Sexual Activity   Alcohol use: Not Currently   Drug use: Not Currently   Sexual activity: Not on file  Other Topics Concern   Not on file  Social History Narrative   Not on file   Social Determinants of Health   Financial Resource Strain: Not on file  Food Insecurity: Not on file  Transportation Needs: Not on file  Physical Activity: Not on file  Stress: Not on file  Social Connections: Not on file  Intimate Partner Violence: Not on file    Mr. Scott Crosby's family history includes Brain cancer in his paternal grandfather; Diabetes in his maternal aunt, maternal grandmother, and mother; Heart attack in his maternal grandfather; Hyperlipidemia in his father; Hypertension in his father; Stroke in his brother; Thyroid cancer in his father.      Objective:    Vitals:   04/09/23 1356  BP: 112/74  Pulse: 96    Physical Exam Well-developed  somewhat chronically ill-appearing older male in no acute distress. Ambulates with a cane, accompanied by his father height, Weight, 203 BMI 28  HEENT; nontraumatic normocephalic,  EOMI, PE R LA, sclera anicteric. Oropharynx; not examined today Neck; supple, no JVD Cardiovascular; regular rate and rhythm with S1-S2, no murmur rub or gallop Pulmonary; Clear bilaterally Abdomen; soft, nontender, nondistended, no palpable mass or hepatosplenomegaly, bowel sounds are active Rectal; not done today Skin; benign exam, no jaundice rash or appreciable lesions Extremities; no clubbing cyanosis or edema skin warm and dry dialysis graft in the right upper extremity Neuro/Psych; alert and  oriented x4, grossly nonfocal mood and affect appropriate        Assessment & Plan:   #24 57 year old male referred for screening colonoscopy in setting of end-stage renal disease on dialysis. Currently asymptomatic, negative family history  #2 hypertension #3.  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 Chales Crosby which will need to be done at the hospital due to dialysis.  He has been scheduled for early September. Colonoscopy was discussed in detail with the patient and his father today including indications risks and benefits and he is agreeable to proceed. Further recommendations pending findings at colonoscopy.   Scott Crosby S Scott Henney PA-C 04/09/2023   Cc: Scott Crosby, *

## 2023-05-01 NOTE — Progress Notes (Signed)
Agree with assessment/plan.  Raj Gupta, MD [facility] [phone]  

## 2023-06-30 ENCOUNTER — Encounter (HOSPITAL_COMMUNITY): Payer: Self-pay | Admitting: Gastroenterology

## 2023-07-07 ENCOUNTER — Ambulatory Visit (HOSPITAL_COMMUNITY)
Admission: RE | Admit: 2023-07-07 | Discharge: 2023-07-07 | Disposition: A | Discharge status: Home / Self Care | Payer: 59 | Attending: Gastroenterology | Admitting: Gastroenterology

## 2023-07-07 ENCOUNTER — Encounter (HOSPITAL_COMMUNITY)
Admission: RE | Disposition: A | Discharge status: Home / Self Care | Payer: Self-pay | Source: Home / Self Care | Attending: Gastroenterology

## 2023-07-07 ENCOUNTER — Other Ambulatory Visit: Payer: Self-pay

## 2023-07-07 ENCOUNTER — Ambulatory Visit (HOSPITAL_COMMUNITY): Payer: 59 | Admitting: Certified Registered Nurse Anesthetist

## 2023-07-07 ENCOUNTER — Encounter (HOSPITAL_COMMUNITY): Payer: Self-pay | Admitting: Gastroenterology

## 2023-07-07 DIAGNOSIS — K648 Other hemorrhoids: Secondary | ICD-10-CM | POA: Diagnosis not present

## 2023-07-07 DIAGNOSIS — Z1211 Encounter for screening for malignant neoplasm of colon: Secondary | ICD-10-CM

## 2023-07-07 DIAGNOSIS — I132 Hypertensive heart and chronic kidney disease with heart failure and with stage 5 chronic kidney disease, or end stage renal disease: Secondary | ICD-10-CM

## 2023-07-07 DIAGNOSIS — N186 End stage renal disease: Secondary | ICD-10-CM | POA: Diagnosis not present

## 2023-07-07 DIAGNOSIS — I509 Heart failure, unspecified: Secondary | ICD-10-CM

## 2023-07-07 DIAGNOSIS — D125 Benign neoplasm of sigmoid colon: Secondary | ICD-10-CM

## 2023-07-07 DIAGNOSIS — K640 First degree hemorrhoids: Secondary | ICD-10-CM | POA: Diagnosis not present

## 2023-07-07 DIAGNOSIS — Z992 Dependence on renal dialysis: Secondary | ICD-10-CM | POA: Insufficient documentation

## 2023-07-07 DIAGNOSIS — I69354 Hemiplegia and hemiparesis following cerebral infarction affecting left non-dominant side: Secondary | ICD-10-CM | POA: Diagnosis not present

## 2023-07-07 DIAGNOSIS — K5730 Diverticulosis of large intestine without perforation or abscess without bleeding: Secondary | ICD-10-CM | POA: Diagnosis not present

## 2023-07-07 DIAGNOSIS — K6389 Other specified diseases of intestine: Secondary | ICD-10-CM

## 2023-07-07 HISTORY — PX: COLONOSCOPY WITH PROPOFOL: SHX5780

## 2023-07-07 HISTORY — PX: POLYPECTOMY: SHX5525

## 2023-07-07 LAB — POCT I-STAT, CHEM 8
BUN: 41 mg/dL — ABNORMAL HIGH (ref 6–20)
Calcium, Ion: 1.1 mmol/L — ABNORMAL LOW (ref 1.15–1.40)
Chloride: 94 mmol/L — ABNORMAL LOW (ref 98–111)
Creatinine, Ser: 11.3 mg/dL — ABNORMAL HIGH (ref 0.61–1.24)
Glucose, Bld: 90 mg/dL (ref 70–99)
HCT: 33 % — ABNORMAL LOW (ref 39.0–52.0)
Hemoglobin: 11.2 g/dL — ABNORMAL LOW (ref 13.0–17.0)
Potassium: 3.8 mmol/L (ref 3.5–5.1)
Sodium: 138 mmol/L (ref 135–145)
TCO2: 36 mmol/L — ABNORMAL HIGH (ref 22–32)

## 2023-07-07 SURGERY — COLONOSCOPY WITH PROPOFOL
Anesthesia: Monitor Anesthesia Care

## 2023-07-07 MED ORDER — SODIUM CHLORIDE 0.9 % IV SOLN: INTRAVENOUS | Status: DC

## 2023-07-07 MED ORDER — PROPOFOL 500 MG/50ML IV EMUL
INTRAVENOUS | Status: DC | PRN
  Administered 2023-07-07: 150 ug/kg/min via INTRAVENOUS

## 2023-07-07 SURGICAL SUPPLY — 22 items

## 2023-07-07 NOTE — Anesthesia Postprocedure Evaluation (Signed)
Anesthesia Post Note  Patient: Scott Crosby  Procedure(s) Performed: COLONOSCOPY WITH PROPOFOL POLYPECTOMY     Patient location during evaluation: PACU Anesthesia Type: MAC Level of consciousness: awake and alert Pain management: pain level controlled Vital Signs Assessment: post-procedure vital signs reviewed and stable Respiratory status: spontaneous breathing, nonlabored ventilation and respiratory function stable Cardiovascular status: blood pressure returned to baseline and stable Postop Assessment: no apparent nausea or vomiting Anesthetic complications: no Comments: Some R eye irritation in PACU- feels better after washing out with saline   No notable events documented.  Last Vitals:  Vitals:   07/07/23 0815 07/07/23 0818  BP: 99/74 111/74  Pulse: 71 72  Resp: 16 20  Temp:    SpO2: 97% 98%    Last Pain:  Vitals:   07/07/23 0818  TempSrc:   PainSc: 0-No pain                 Lannie Fields

## 2023-07-07 NOTE — Discharge Instructions (Signed)
YOU HAD AN ENDOSCOPIC PROCEDURE TODAY: Refer to the procedure report and other information in the discharge instructions given to you for any specific questions about what was found during the examination. If this information does not answer your questions, please call [place] office at [phone] to clarify.   YOU SHOULD EXPECT: Some feelings of bloating in the abdomen. Passage of more gas than usual. Walking can help get rid of the air that was put into your GI tract during the procedure and reduce the bloating. If you had a lower endoscopy (such as a colonoscopy or flexible sigmoidoscopy) you may notice spotting of blood in your stool or on the toilet paper. Some abdominal soreness may be present for a day or two, also.  DIET: Your first meal following the procedure should be a light meal and then it is ok to progress to your normal diet. A half-sandwich or bowl of soup is an example of a good first meal. Heavy or fried foods are harder to digest and may make you feel nauseous or bloated. Drink plenty of fluids but you should avoid alcoholic beverages for 24 hours.  ACTIVITY: Your care partner should take you home directly after the procedure. You should plan to take it easy, moving slowly for the rest of the day. You can resume normal activity the day after the procedure however YOU SHOULD NOT DRIVE, use power tools, machinery or perform tasks that involve climbing or major physical exertion for 24 hours (because of the sedation medicines used during the test).   SYMPTOMS TO REPORT IMMEDIATELY: A gastroenterologist can be reached at any hour. Please call [phone]  for any of the following symptoms:  Following lower endoscopy (colonoscopy, flexible sigmoidoscopy) Excessive amounts of blood in the stool  Significant tenderness, worsening of abdominal pains  Swelling of the abdomen that is new, acute  Fever of 100 or higher  FOLLOW UP:  If any biopsies were taken you will be contacted by  phone or by letter within the next 1-3 weeks. Call [phone]  if you have not heard about the biopsies in 3 weeks.  Please also call with any specific questions about appointments or follow up tests.

## 2023-07-07 NOTE — Op Note (Signed)
[facility] Patient Name: Scott Crosby Procedure Date: 07/07/2023 MRN: [id number] Attending MD: Lynann Bologna , MD, [id number] Date of Birth: 1966/07/01 CSN: [id number] Age: 57 Admit Type: Outpatient Procedure:                Colonoscopy Indications:              Screening for colorectal malignant neoplasm Providers:                Lynann Bologna, MD, Doristine Mango, RN, Rozetta Nunnery, Technician Referring MD:             Lourena Simmonds, Donald Pore, MD Medicines:                Monitored Anesthesia Care Complications:            No immediate complications. Estimated Blood Loss:     Estimated blood loss: none. Procedure:                Pre-Anesthesia Assessment:                           - Prior to the procedure, a History and Physical                            was performed, and patient medications and                            allergies were reviewed. The patient's tolerance of                            previous anesthesia was also reviewed. The risks                            and benefits of the procedure and the sedation                            options and risks were discussed with the patient.                            All questions were answered, and informed consent                            was obtained. Prior Anticoagulants: The patient has                            taken no anticoagulant or antiplatelet agents. ASA                            Grade Assessment: III - A patient with severe                            systemic disease. After reviewing the risks and  benefits, the patient was deemed in satisfactory                            condition to undergo the procedure.                           After obtaining informed consent, the colonoscope                            was passed under direct vision. Throughout the                            procedure, the patient's blood pressure, pulse, and                             oxygen saturations were monitored continuously. The                            PCF-HQ190L ([id number] Olympus colonoscope was                            introduced through the anus and advanced to the 2                            cm into the ileum. The colonoscopy was performed                            without difficulty. The patient tolerated the                            procedure well. The quality of the bowel                            preparation was adequate to identify polyps. The                            terminal ileum, ileocecal valve, appendiceal                            orifice, and rectum were photographed. Scope In: 7:35:04 AM Scope Out: 7:53:41 AM Scope Withdrawal Time: 0 hours 14 minutes 25 seconds  Total Procedure Duration: 0 hours 18 minutes 37 seconds  Findings:      A 10 mm polyp was found in the distal sigmoid colon. The polyp was       semi-pedunculated. The polyp was removed with a hot snare. Resection and       retrieval were complete.      A few small-mouthed diverticula were found in the sigmoid colon.      An area of mild melanosis was found in the entire colon.      Non-bleeding internal hemorrhoids were found during retroflexion and       during perianal exam. The hemorrhoids were small and Grade I (internal       hemorrhoids that do not prolapse).      The terminal ileum appeared normal.  The exam was otherwise without abnormality on direct and retroflexion       views. Impression:               - One 10 mm polyp in the distal sigmoid colon,                            removed with a hot snare. Resected and retrieved.                           - Mild sigmoid diverticulosis.                           - Minimal melanosis in the colon.                           - Non-bleeding internal hemorrhoids.                           - The examined portion of the ileum was normal.                           - The examination was otherwise  normal on direct                            and retroflexion views. Moderate Sedation:      Not Applicable - Patient had care per Anesthesia. Recommendation:           - Patient has a contact number available for                            emergencies. The signs and symptoms of potential                            delayed complications were discussed with the                            patient. Return to normal activities tomorrow.                            Written discharge instructions were provided to the                            patient.                           - Resume previous diet.                           - Continue present medications.                           - Await pathology results.                           - Repeat colonoscopy for surveillance based on  pathology results.                           - The findings and recommendations were discussed                            with the patient's family. Procedure Code(s):        --- Professional ---                           [phone], Colonoscopy, flexible; with removal of                            tumor(s), polyp(s), or other lesion(s) by snare                            technique Diagnosis Code(s):        --- Professional ---                           Z12.11, Encounter for screening for malignant                            neoplasm of colon                           D12.5, Benign neoplasm of sigmoid colon                           K64.0, First degree hemorrhoids                           K63.89, Other specified diseases of intestine                           K57.30, Diverticulosis of large intestine without                            perforation or abscess without bleeding CPT copyright 2022 American Medical Association. All rights reserved. The codes documented in this report are preliminary and upon coder review may  be revised to meet current compliance requirements. Lynann Bologna,  MD 07/07/2023 8:01:01 AM This report has been signed electronically. Number of Addenda: 0

## 2023-07-07 NOTE — Transfer of Care (Signed)
Immediate Anesthesia Transfer of Care Note  Patient: Ayson Cherubini  Procedure(s) Performed: COLONOSCOPY WITH PROPOFOL POLYPECTOMY  Patient Location: PACU  Anesthesia Type:MAC  Level of Consciousness: sedated and patient cooperative  Airway & Oxygen Therapy: Patient Spontanous Breathing and Patient connected to face mask oxygen  Post-op Assessment: Report given to RN and Post -op Vital signs reviewed and stable  Post vital signs: Reviewed and stable  Last Vitals:  Vitals Value Taken Time  BP    Temp    Pulse    Resp    SpO2      Last Pain:  Vitals:   07/07/23 0638  TempSrc: Tympanic  PainSc: 0-No pain         Complications: No notable events documented.

## 2023-07-07 NOTE — Anesthesia Preprocedure Evaluation (Addendum)
Anesthesia Evaluation  Patient identified by MRN, date of birth, ID band Patient awake    Reviewed: Allergy & Precautions, H&P , NPO status , Patient's Chart, lab work & pertinent test results  Airway Mallampati: III  TM Distance: >3 FB Neck ROM: Full    Dental  (+) Chipped, Dental Advisory Given,    Pulmonary neg pulmonary ROS   Pulmonary exam normal breath sounds clear to auscultation       Cardiovascular hypertension, Pt. on medications +CHF (systolid HF listed in problem list, unabel to find any echocardiograms)  Normal cardiovascular exam Rhythm:Regular Rate:Normal     Neuro/Psych CVA (L sided weakness, CVA about 5 years ago), Residual Symptoms  negative psych ROS   GI/Hepatic Neg liver ROS,,,CRC screen   Endo/Other  negative endocrine ROS    Renal/GU ESRF and DialysisRenal diseaseLast HD Saturday   negative genitourinary   Musculoskeletal negative musculoskeletal ROS (+)    Abdominal   Peds negative pediatric ROS (+)  Hematology negative hematology ROS (+)   Anesthesia Other Findings   Reproductive/Obstetrics negative OB ROS                              Anesthesia Physical Anesthesia Plan  ASA: 3  Anesthesia Plan: MAC   Post-op Pain Management:    Induction:   PONV Risk Score and Plan: 2 and Propofol infusion and TIVA  Airway Management Planned: Natural Airway and Simple Face Mask  Additional Equipment: None  Intra-op Plan:   Post-operative Plan:   Informed Consent: I have reviewed the patients History and Physical, chart, labs and discussed the procedure including the risks, benefits and alternatives for the proposed anesthesia with the patient or authorized representative who has indicated his/her understanding and acceptance.       Plan Discussed with: CRNA  Anesthesia Plan Comments:        Anesthesia Quick Evaluation

## 2023-07-07 NOTE — H&P (Signed)
[facility] History and Physical   Primary Care Physician:  Lourena Simmonds, Donald Pore, MD   Reason for Procedure:   CRC screening  Plan:    none     HPI: Scott Crosby is a 57 y.o. male  Pt with ESRD on HD (TTS) for screening colon   Past Medical History:  Diagnosis Date   Chronic kidney disease    ESRD (end stage renal disease) on dialysis (HCC)    Hyperlipidemia    Hypertension    Stroke (HCC)    Systolic heart failure [facility])     Past Surgical History:  Procedure Laterality Date   AV FISTULA PLACEMENT Left 02/04/2022   Procedure: LEFT BRACHIOCEPHALIC VEIN FISTULA CREATION;  Surgeon: Leonie Douglas, MD;  Location: [facility] OR;  Service: Vascular;  Laterality: Left;  PERIPHERAL NERVE BLOCK   IR FLUORO GUIDE CV LINE RIGHT  12/17/2021    Prior to Admission medications   Medication Sig Start Date End Date Taking? Authorizing Provider  atorvastatin (LIPITOR) 10 MG tablet Take 10 mg by mouth daily. 12/29/21  Yes [provider]  B Complex-C-Folic Acid (DIALYVITE 800) 0.8 MG TABS Take 1 tablet by mouth daily.   Yes [provider]  calcium acetate (PHOSLO) 667 MG tablet Take 1,334 mg by mouth 3 (three) times daily with meals. 01/11/22  Yes [provider]  losartan (COZAAR) 100 MG tablet Take 100 mg by mouth daily. 01/19/22  Yes [provider]  sucroferric oxyhydroxide (VELPHORO) 500 MG chewable tablet Chew 500 mg by mouth 3 (three) times daily with meals. 12/25/22  Yes [provider]    Current Facility-Administered Medications  Medication Dose Route Frequency Provider Last Rate Last Admin   0.9 %  sodium chloride infusion   Intravenous Continuous Esterwood, Amy S, PA-C 20 mL/hr at 07/07/23 0704 New Bag at 07/07/23 0704    Allergies as of 04/09/2023   (No Known Allergies)    Family History  Problem Relation Age of Onset   Diabetes Mother    Thyroid cancer Father    Hypertension Father    Hyperlipidemia Father    Stroke  Brother    Diabetes Maternal Grandmother    Heart attack Maternal Grandfather    Brain cancer Paternal Grandfather    Diabetes Maternal Aunt     Social History   Socioeconomic History   Marital status: Single    Spouse name: Not on file   Number of children: 0   Years of education: Not on file   Highest education level: Not on file  Occupational History   Occupation: disabled  Tobacco Use   Smoking status: Never   Smokeless tobacco: Never  Vaping Use   Vaping status: Never Used  Substance and Sexual Activity   Alcohol use: Not Currently   Drug use: Not Currently   Sexual activity: Not on file  Other Topics Concern   Not on file  Social History Narrative   Not on file   Social Determinants of Health   Financial Resource Strain: Medium Risk (09/12/2021)   Received from [employer] [facility]   Overall Financial Resource Strain (CARDIA)    Difficulty of Paying Living Expenses: Somewhat hard  Food Insecurity: No Food Insecurity (09/12/2021)   Received from [employer] [facility] Vital Sign    Worried About Running Out of Food in the Last Year: Never true    Ran Out of Food in the Last Year: Never true  Transportation Needs: No Transportation Needs (09/12/2021)  Received from [facility] [facility] - Transportation    Lack of Transportation (Medical): No    Lack of Transportation (Non-Medical): No  Physical Activity: Inactive (09/12/2021)   Received from [facility]   Exercise Vital Sign    Days of Exercise per Week: 0 days    Minutes of Exercise per Session: 0 min  Stress: No Stress Concern Present (09/12/2021)   Received from [facility] - Occupational Stress Questionnaire    Feeling of Stress : Not at all  Social Connections: Unknown (06/04/2023)   Received from [facility]   Social Network    Social Network: Not on file  Intimate Partner Violence: Unknown (06/04/2023)   Received from [facility]   HITS    Physically Hurt: Not on file    Insult or Talk Down To: Not on file    Threaten Physical Harm: Not on file    Scream or Curse: Not on file    Review of Systems: Positive for none All other review of systems negative except as mentioned in the HPI.  Physical Exam: Vital signs in last 24 hours: Temp:  [97.5 F (36.4 C)] 97.5 F (36.4 C) (09/30 4098) Pulse Rate:  [82] 82 (09/30 0638) Resp:  [12] 12 (09/30 0638) SpO2:  [97 %] 97 % (09/30 1191) Weight:  [92.3 kg] 92.3 kg (09/30 4782)   General:   Alert,  Well-developed, well-nourished, pleasant and cooperative in NAD Lungs:  Clear throughout to auscultation.   Heart:  Regular rate and rhythm; no murmurs, clicks, rubs,  or gallops. Abdomen:  Soft, nontender and nondistended. Normal bowel sounds.   Neuro/Psych:  Alert and cooperative. Normal mood and affect. A and O x 3    No significant changes were identified.  The patient continues to be an appropriate candidate for the planned procedure and anesthesia.   Edman Circle, MD. [facility] 07/07/2023 7:10 AM@

## 2023-07-08 ENCOUNTER — Encounter (HOSPITAL_COMMUNITY): Payer: Self-pay | Admitting: Gastroenterology

## 2023-07-08 LAB — SURGICAL PATHOLOGY

## 2023-07-13 ENCOUNTER — Encounter: Payer: Self-pay | Admitting: Gastroenterology

## 2023-10-10 ENCOUNTER — Encounter (HOSPITAL_COMMUNITY)
Admission: RE | Disposition: A | Discharge status: Home / Self Care | Payer: Self-pay | Source: Home / Self Care | Attending: Nephrology

## 2023-10-10 ENCOUNTER — Ambulatory Visit (HOSPITAL_COMMUNITY)
Admission: RE | Admit: 2023-10-10 | Discharge: 2023-10-10 | Disposition: A | Discharge status: Home / Self Care | Payer: 59 | Attending: Nephrology | Admitting: Nephrology

## 2023-10-10 ENCOUNTER — Other Ambulatory Visit: Payer: Self-pay

## 2023-10-10 DIAGNOSIS — E785 Hyperlipidemia, unspecified: Secondary | ICD-10-CM | POA: Diagnosis not present

## 2023-10-10 DIAGNOSIS — Z992 Dependence on renal dialysis: Secondary | ICD-10-CM | POA: Insufficient documentation

## 2023-10-10 DIAGNOSIS — T82858A Stenosis of vascular prosthetic devices, implants and grafts, initial encounter: Secondary | ICD-10-CM | POA: Diagnosis present

## 2023-10-10 DIAGNOSIS — N186 End stage renal disease: Secondary | ICD-10-CM | POA: Diagnosis not present

## 2023-10-10 DIAGNOSIS — D631 Anemia in chronic kidney disease: Secondary | ICD-10-CM | POA: Diagnosis not present

## 2023-10-10 DIAGNOSIS — Y832 Surgical operation with anastomosis, bypass or graft as the cause of abnormal reaction of the patient, or of later complication, without mention of misadventure at the time of the procedure: Secondary | ICD-10-CM | POA: Diagnosis not present

## 2023-10-10 DIAGNOSIS — Z79899 Other long term (current) drug therapy: Secondary | ICD-10-CM | POA: Diagnosis not present

## 2023-10-10 DIAGNOSIS — I132 Hypertensive heart and chronic kidney disease with heart failure and with stage 5 chronic kidney disease, or end stage renal disease: Secondary | ICD-10-CM | POA: Insufficient documentation

## 2023-10-10 DIAGNOSIS — I5022 Chronic systolic (congestive) heart failure: Secondary | ICD-10-CM | POA: Insufficient documentation

## 2023-10-10 HISTORY — PX: A/V FISTULAGRAM: CATH118298

## 2023-10-10 HISTORY — PX: PERIPHERAL VASCULAR BALLOON ANGIOPLASTY: CATH118281

## 2023-10-10 SURGERY — A/V FISTULAGRAM
Anesthesia: LOCAL

## 2023-10-10 MED ORDER — SODIUM CHLORIDE 0.9 % IV SOLN: INTRAVENOUS | Status: DC

## 2023-10-10 MED ORDER — HEPARIN (PORCINE) IN NACL 1000-0.9 UT/500ML-% IV SOLN
INTRAVENOUS | Status: DC | PRN
  Administered 2023-10-10: 500 mL

## 2023-10-10 MED ORDER — LIDOCAINE HCL (PF) 1 % IJ SOLN
INTRAMUSCULAR | Status: AC
  Filled 2023-10-10: qty 30

## 2023-10-10 MED ORDER — IODIXANOL 320 MG/ML IV SOLN
INTRAVENOUS | Status: DC | PRN
  Administered 2023-10-10: 10 mL

## 2023-10-10 MED ORDER — MIDAZOLAM HCL 2 MG/2ML IJ SOLN
INTRAMUSCULAR | Status: DC | PRN
  Administered 2023-10-10: 1 mg via INTRAVENOUS

## 2023-10-10 MED ORDER — LIDOCAINE HCL (PF) 1 % IJ SOLN
INTRAMUSCULAR | Status: DC | PRN
  Administered 2023-10-10 (×2): 3 mL

## 2023-10-10 MED ORDER — MIDAZOLAM HCL 2 MG/2ML IJ SOLN
INTRAMUSCULAR | Status: AC
  Filled 2023-10-10: qty 2

## 2023-10-10 MED ORDER — FENTANYL CITRATE (PF) 100 MCG/2ML IJ SOLN
INTRAMUSCULAR | Status: AC
  Filled 2023-10-10: qty 2

## 2023-10-10 MED ORDER — FENTANYL CITRATE (PF) 100 MCG/2ML IJ SOLN
INTRAMUSCULAR | Status: DC | PRN
  Administered 2023-10-10: 25 ug via INTRAVENOUS

## 2023-10-10 SURGICAL SUPPLY — 11 items
BAG SNAP BAND KOVER 36X36 (MISCELLANEOUS) ×2 IMPLANT
BALLN MUSTANG 8.0X40 75 (BALLOONS) ×2
BALLOON MUSTANG 8.0X40 75 (BALLOONS) IMPLANT
CATH ANGIO 5F BER2 65CM (CATHETERS) IMPLANT
COVER DOME SNAP 22 D (MISCELLANEOUS) ×2 IMPLANT
GUIDEWIRE ANGLED .035X150CM (WIRE) IMPLANT
KIT MICROPUNCTURE NIT STIFF (SHEATH) IMPLANT
SHEATH PINNACLE R/O II 6F 4CM (SHEATH) IMPLANT
SYR MEDALLION 10ML (SYRINGE) IMPLANT
TRAY PV CATH (CUSTOM PROCEDURE TRAY) ×2 IMPLANT
WIRE BENTSON .035X145CM (WIRE) IMPLANT

## 2023-10-10 NOTE — H&P (Signed)
 Chief Complaint: Fistula evaluation HPI:  57 year old man with past medical history significant for hypertension, dyslipidemia, HFrEF and end-stage renal disease on hemodialysis.  He has a left brachiocephalic fistula that was created by Dr. Magda on 02/04/2022 and presents today for concerns of aspirating clots intermittently from the fistula.  He has had some episodes of prolonged bleeding and denies any steal symptoms.  He denies any fever or chills and does not have any cough, sputum production or wheezing.  Denies any recent nausea, vomiting or diarrhea.  Procedure explained to the patient which she understands and is willing to proceed.  Past Medical History:  Diagnosis Date   Chronic kidney disease    ESRD (end stage renal disease) on dialysis (HCC)    Hyperlipidemia    Hypertension    Stroke (HCC)    Systolic heart failure [facility])     Past Surgical History:  Procedure Laterality Date   AV FISTULA PLACEMENT Left 02/04/2022   Procedure: LEFT BRACHIOCEPHALIC VEIN FISTULA CREATION;  Surgeon: Magda Debby SAILOR, MD;  Location: [facility] OR;  Service: Vascular;  Laterality: Left;  PERIPHERAL NERVE BLOCK   COLONOSCOPY WITH PROPOFOL  N/A 07/07/2023   Procedure: COLONOSCOPY WITH PROPOFOL ;  Surgeon: Charlanne Groom, MD;  Location: [facility] ENDOSCOPY;  Service: Gastroenterology;  Laterality: N/A;   IR FLUORO GUIDE CV LINE RIGHT  12/17/2021   POLYPECTOMY  07/07/2023   Procedure: POLYPECTOMY;  Surgeon: Charlanne Groom, MD;  Location: [facility] ENDOSCOPY;  Service: Gastroenterology;;    Family History  Problem Relation Age of Onset   Diabetes Mother    Thyroid cancer Father    Hypertension Father    Hyperlipidemia Father    Stroke Brother    Diabetes Maternal Grandmother    Heart attack Maternal Grandfather    Brain cancer Paternal Grandfather    Diabetes Maternal Aunt    Social History:  reports that he has never smoked. He has never used smokeless tobacco. He reports that he does not currently use alcohol. He  reports that he does not currently use drugs.  Allergies: No Known Allergies  Medications Prior to Admission  Medication Sig Dispense Refill   atorvastatin (LIPITOR) 10 MG tablet Take 10 mg by mouth daily.     B Complex-C-Folic Acid (DIALYVITE 800) 0.8 MG TABS Take 1 tablet by mouth daily.     losartan (COZAAR) 100 MG tablet Take 100 mg by mouth daily.     sucroferric oxyhydroxide (VELPHORO) 500 MG chewable tablet Chew 1,000 mg by mouth 3 (three) times daily with meals.      No results found for this or any previous visit (from the past 48 hours). No results found.  Review of Systems  All other systems reviewed and are negative.   Blood pressure 117/84, pulse 89, resp. rate 14, weight 90.7 kg, SpO2 100%. Physical Exam Vitals reviewed.  Constitutional:      Appearance: Normal appearance. He is normal weight.  HENT:     Head: Normocephalic and atraumatic.     Right Ear: External ear normal.     Left Ear: External ear normal.     Nose: Nose normal.     Mouth/Throat:     Mouth: Mucous membranes are dry.     Pharynx: Oropharynx is clear.  Eyes:     Extraocular Movements: Extraocular movements intact.     Conjunctiva/sclera: Conjunctivae normal.  Cardiovascular:     Rate and Rhythm: Normal rate and regular rhythm.     Pulses: Normal pulses.  Heart sounds: Normal heart sounds.  Pulmonary:     Effort: Pulmonary effort is normal.     Breath sounds: Normal breath sounds. No wheezing or rales.  Abdominal:     General: Bowel sounds are normal.     Palpations: Abdomen is soft.  Musculoskeletal:     Right lower leg: No edema.     Left lower leg: No edema.     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  Skin:    General: Skin is warm and dry.  Neurological:     Mental Status: He is alert and oriented to person, place, and time.      Assessment/Plan 1.  Cannulation difficulties of left brachiocephalic fistula:  First procedure on his 60-1/2-year-old fistula.  Will plan to perform fistulogram to evaluate for problems that may be leading to his presenting symptoms and offer intervention. 2.  End-stage renal disease: Resume dialysis per outpatient schedule. 3.  Hypertension: Ongoing blood pressure management with dialysis/outpatient management. 4.  Anemia of chronic kidney disease: ESA per HD unit protocol  Gordy MARLA Blanch, MD 10/10/2023, 7:55 AM

## 2023-10-10 NOTE — Discharge Instructions (Signed)

## 2023-10-10 NOTE — Op Note (Signed)
 Indication: Patient presents with difficult cannulation in his left BCF (placed Feb 04, 2022 by Dr. Magda.  This is the first time we are intervening on this current access.  Dialyzes at [facility] with Dr. Dennise with last full treatment on January 2.  Interventional nephrologist: Dr. Lynwood Farr Assistant: Dr. Gordy Blanch .  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 3)      The centrals were widely patent. 4)      This right BCF remains amenable to future percutaneous intervention.  Description of procedure: The arm was prepped and draped in the usual sterile fashion. The right upper arm brachial cephalic fistula was cannulated (63097) with a 21 micropuncture needle directed in an antegrade direction.  After a angiogram was performed we also cannulated in the retrograde direction with a 21-gauge micropuncture needle exchanged out for a 6 French sheath.  Contrast [phone]) injection via the side port of the sheath was performed. 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  The wire was advanced in the retrograde direction past the anastomosis and the tip of the wire was advanced into the proximal brachial artery.  Arteriogram was performed from the proximal brachial artery revealing a patent brachial artery, anastomosis with a 70% inflow juxta as well as a 80% inflow limb focal cephalic vein stenosis.    A  8 mm Mustang angioplasty balloon was inserted over a glide wire and positioned at the inflow cephalic vein stenosis. Venous  angioplasty (63097) was carried out to 16-18 ATM with FULL effacement of the waist on the balloon.  Wire was advanced through the antegrade sheath centrally and then a 8 mm Mustang angioplasty balloon was advanced over the guidewire to the outflow focal cephalic vein stenosis. Venous angioplasty (63097) was carried out to 16-18 ATM with FULL effacement of the waist on the balloon.  Final angiogram revealed 10% residual at all areas levels of stenosis with no evidence of extravasation or dissection.  Flow of contrast was faster and the access was much less pulsatile.  Hemostasis: A 3-0 ethilon purse string suture was placed at the cannulation site on removal of the sheath.  Sedation: 1 mg Versed , 25 mcg Fentanyl . Sedation time. 28 minutes  Contrast. 10 mL  Monitoring: Because of the patient's comorbid conditions and sedation during the procedure, continuous EKG monitoring and O2 saturation monitoring was performed throughout the procedure by the RN. There were no abnormal arrhythmias encountered.  Complications: None.   Diagnoses: I87.1 Stricture of vein  N18.6 ESRD T82.858A Stricture of access  Procedure Coding:  [id number] Cannulation and angiogram of fistula, venous angioplasty (cephalic vein arch) V0032 Contrast  Recommendations:  1. Continue to cannulate the fistula with 15G needles.  2. Refer back for problems with flows. 3. Remove the suture next treatment.   Discharge: The patient was discharged home in stable condition. The patient was given education regarding the care of the dialysis access AVF and specific instructions in case of any problems.

## 2023-10-13 ENCOUNTER — Encounter (HOSPITAL_COMMUNITY): Payer: Self-pay | Admitting: Nephrology

## 2024-04-24 ENCOUNTER — Emergency Department (HOSPITAL_COMMUNITY)

## 2024-04-24 ENCOUNTER — Emergency Department (HOSPITAL_COMMUNITY)
Admission: EM | Admit: 2024-04-24 | Discharge: 2024-04-24 | Disposition: A | Discharge status: Home / Self Care | Attending: Emergency Medicine | Admitting: Emergency Medicine

## 2024-04-24 ENCOUNTER — Other Ambulatory Visit: Payer: Self-pay

## 2024-04-24 DIAGNOSIS — N186 End stage renal disease: Secondary | ICD-10-CM | POA: Insufficient documentation

## 2024-04-24 DIAGNOSIS — T82510A Breakdown (mechanical) of surgically created arteriovenous fistula, initial encounter: Secondary | ICD-10-CM

## 2024-04-24 DIAGNOSIS — Z992 Dependence on renal dialysis: Secondary | ICD-10-CM | POA: Insufficient documentation

## 2024-04-24 DIAGNOSIS — E875 Hyperkalemia: Secondary | ICD-10-CM | POA: Insufficient documentation

## 2024-04-24 DIAGNOSIS — M7989 Other specified soft tissue disorders: Secondary | ICD-10-CM | POA: Diagnosis present

## 2024-04-24 DIAGNOSIS — L0390 Cellulitis, unspecified: Secondary | ICD-10-CM

## 2024-04-24 LAB — BASIC METABOLIC PANEL WITH GFR
Anion gap: 15 (ref 5–15)
BUN: 59 mg/dL — ABNORMAL HIGH (ref 6–20)
CO2: 30 mmol/L (ref 22–32)
Calcium: 9.3 mg/dL (ref 8.9–10.3)
Chloride: 91 mmol/L — ABNORMAL LOW (ref 98–111)
Creatinine, Ser: 11.94 mg/dL — ABNORMAL HIGH (ref 0.61–1.24)
GFR, Estimated: 4 mL/min — ABNORMAL LOW (ref >60)
Glucose, Bld: 91 mg/dL (ref 70–99)
Potassium: 6 mmol/L — ABNORMAL HIGH (ref 3.5–5.1)
Sodium: 136 mmol/L (ref 135–145)

## 2024-04-24 LAB — CBC WITH DIFFERENTIAL/PLATELET
Abs Immature Granulocytes: 0.02 K/uL (ref 0.00–0.07)
Basophils Absolute: 0.1 K/uL (ref 0.0–0.1)
Basophils Relative: 1 %
Eosinophils Absolute: 0.2 K/uL (ref 0.0–0.5)
Eosinophils Relative: 2 %
HCT: 35.4 % — ABNORMAL LOW (ref 39.0–52.0)
Hemoglobin: 11.7 g/dL — ABNORMAL LOW (ref 13.0–17.0)
Immature Granulocytes: 0 %
Lymphocytes Relative: 18 %
Lymphs Abs: 1.4 K/uL (ref 0.7–4.0)
MCH: 34.5 pg — ABNORMAL HIGH (ref 26.0–34.0)
MCHC: 33.1 g/dL (ref 30.0–36.0)
MCV: 104.4 fL — ABNORMAL HIGH (ref 80.0–100.0)
Monocytes Absolute: 0.5 K/uL (ref 0.1–1.0)
Monocytes Relative: 7 %
Neutro Abs: 5.5 K/uL (ref 1.7–7.7)
Neutrophils Relative %: 72 %
Platelets: 201 K/uL (ref 150–400)
RBC: 3.39 MIL/uL — ABNORMAL LOW (ref 4.22–5.81)
RDW: 14.3 % (ref 11.5–15.5)
WBC: 7.6 K/uL (ref 4.0–10.5)
nRBC: 0 % (ref 0.0–0.2)

## 2024-04-24 MED ORDER — CEPHALEXIN 500 MG PO CAPS: 500.0000 mg | ORAL_CAPSULE | Freq: Four times a day (QID) | ORAL | 0 refills | Status: AC

## 2024-04-24 MED ORDER — SODIUM ZIRCONIUM CYCLOSILICATE 10 G PO PACK
10.0000 g | PACK | ORAL | Status: AC
  Administered 2024-04-24: 10 g via ORAL
  Filled 2024-04-24: qty 1

## 2024-04-24 NOTE — ED Triage Notes (Signed)
 Pt sent from dialysis center for evaluation of LUE fistula. Family reports it is about 20% larger than normal and warm to the touch. Did not receive dialysis this morning

## 2024-04-24 NOTE — Progress Notes (Signed)
 VASCULAR LAB    Left upper extremity duplex dialysis access has been performed.  See CV proc for preliminary results.   Siyon Linck, RVT 04/24/2024, 2:39 PM

## 2024-04-24 NOTE — Discharge Instructions (Addendum)
 Follow-up with Dr. Silver next week.  His office will call you.

## 2024-04-24 NOTE — ED Provider Notes (Addendum)
  EMERGENCY DEPARTMENT AT [facility] Provider Note   CSN: [id number] Arrival date & time: 04/24/24  9245     Patient presents with: Vascular Access Problem   Scott Crosby is a 58 y.o. male.   58 year old male presents due to swelling at the left dialysis catheter site.  Patient has a history of ESRD and went to dialysis today and they did not want to use his catheter because they were concerned about possible infection versus blockage.  Family at bedside states that the graft appears to be about 20% larger than it has been for the past.  Patient denies any neurological system distal to the graft site.  No recent fever or chills.       Prior to Admission medications   Medication Sig Start Date End Date Taking? Authorizing Provider  atorvastatin (LIPITOR) 10 MG tablet Take 10 mg by mouth daily. 12/29/21   [provider]  B Complex-C-Folic Acid (DIALYVITE 800) 0.8 MG TABS Take 1 tablet by mouth daily.    [provider]  losartan (COZAAR) 100 MG tablet Take 100 mg by mouth daily. 01/19/22   [provider]  sucroferric oxyhydroxide (VELPHORO) 500 MG chewable tablet Chew 1,000 mg by mouth 3 (three) times daily with meals. 12/25/22   [provider]    Allergies: Patient has no known allergies.    Review of Systems  All other systems reviewed and are negative.   Updated Vital Signs BP 113/79 (BP Location: Right Arm)   Pulse 80   Temp 97.8 F (36.6 C) (Oral)   Resp 18   Ht 1.778 m (5' 10)   Wt 90.7 kg   SpO2 100%   BMI 28.70 kg/m   Physical Exam Vitals and nursing note reviewed.  Constitutional:      General: He is not in acute distress.    Appearance: Normal appearance. He is well-developed. He is not toxic-appearing.  HENT:     Head: Normocephalic and atraumatic.  Eyes:     General: Lids are normal.     Conjunctiva/sclera: Conjunctivae normal.     Pupils: Pupils are equal, round, and reactive to light.   Neck:     Thyroid: No thyroid mass.     Trachea: No tracheal deviation.  Cardiovascular:     Rate and Rhythm: Normal rate and regular rhythm.     Heart sounds: Normal heart sounds. No murmur heard.    No gallop.  Pulmonary:     Effort: Pulmonary effort is normal. No respiratory distress.     Breath sounds: Normal breath sounds. No stridor. No decreased breath sounds, wheezing, rhonchi or rales.  Abdominal:     General: There is no distension.     Palpations: Abdomen is soft.     Tenderness: There is no abdominal tenderness. There is no rebound.  Musculoskeletal:        General: No tenderness. Normal range of motion.       Arms:     Cervical back: Normal range of motion and neck supple.  Skin:    General: Skin is warm and dry.     Findings: No abrasion or rash.  Neurological:     Mental Status: He is alert and oriented to person, place, and time. Mental status is at baseline.     GCS: GCS eye subscore is 4. GCS verbal subscore is 5. GCS motor subscore is 6.     Cranial Nerves: No cranial nerve deficit.  Sensory: No sensory deficit.     Motor: Motor function is intact.  Psychiatric:        Attention and Perception: Attention normal.        Speech: Speech normal.        Behavior: Behavior normal.     (all labs ordered are listed, but only abnormal results are displayed) Labs Reviewed - No data to display  EKG: None ED ECG REPORT   Date: 04/24/2024  Rate: 72  Rhythm: normal sinus rhythm  QRS Axis: normal  Intervals: normal  ST/T Wave abnormalities: normal  Conduction Disutrbances:none  Narrative Interpretation:   Old EKG Reviewed: none available  I have personally reviewed the EKG tracing and agree with the computerized printout as noted.  Radiology: No results found.   Procedures   Medications Ordered in the ED - No data to display                                  Medical Decision Making Amount and/or Complexity of Data Reviewed Labs:  ordered. ECG/medicine tests: ordered.  Risk Prescription drug management.   Gust case with Dr. Silver from vascular surgery.  Recommends patient have a vascular shunt study as well as blood work.  Patient had mild hyperkalemia at 6.0.  Patient given Lokelma  10 g.  Will check EKG.  Patient is not short of breath.  Do not think that he is have emergent dialysis at this time.  Duplex of dialysis access site did not show any evidence of fluid or obstruction.  Skin is slightly warm.  CBC shows no leukocytosis.  Low suspicion for sepsis.  I will start patient on Keflex  and Dr. Silver has scheduled follow-up visit for the patient in the vascular clinic.  Family at bedside and they are agreeable to this     Final diagnoses:  None    ED Discharge Orders     None          Dasie Faden, MD 04/24/24 1453    Dasie Faden, MD 04/24/24 1512

## 2024-04-26 ENCOUNTER — Telehealth: Payer: Self-pay | Admitting: Vascular Surgery

## 2024-05-06 NOTE — Telephone Encounter (Signed)
Appt has scheduled

## 2024-05-06 NOTE — Telephone Encounter (Signed)
 Appt has been scheduled.

## 2024-05-28 ENCOUNTER — Other Ambulatory Visit: Payer: Self-pay

## 2024-05-28 DIAGNOSIS — N186 End stage renal disease: Secondary | ICD-10-CM

## 2024-06-24 ENCOUNTER — Ambulatory Visit (HOSPITAL_COMMUNITY)
Admission: RE | Admit: 2024-06-24 | Discharge: 2024-06-24 | Disposition: A | Discharge status: Home / Self Care | Source: Ambulatory Visit | Attending: Vascular Surgery | Admitting: Vascular Surgery

## 2024-06-24 ENCOUNTER — Ambulatory Visit: Attending: Vascular Surgery | Admitting: Physician Assistant

## 2024-06-24 VITALS — BP 118/76 | HR 109 | Temp 97.8°F | Wt 208.9 lb

## 2024-06-24 DIAGNOSIS — N186 End stage renal disease: Secondary | ICD-10-CM | POA: Insufficient documentation

## 2024-06-24 DIAGNOSIS — Z992 Dependence on renal dialysis: Secondary | ICD-10-CM

## 2024-06-24 NOTE — Progress Notes (Signed)
 HISTORY AND PHYSICAL     CC:  dialysis access Requesting Provider:  Sigrid Kays, [place], *  HPI: This is a 58 y.o. male here for evaluation of his hemodialysis access and he is here with his father.    Pt has hx of left BC AVF 02/04/2022 by Dr. Magda.  He had a fistulogram by Dr. Melia 10/10/2023 with angioplasty of the cephalic vein and the central veins were widely patent. He has hx of a left RC AVF previously placed in [place].  He states this lasted about 4 years.   He was seen in the ER in July bc they were told at dialysis they felt the fistula had gotten larger and was warm.  He had an u/s of his fistula and it was patent without perigraft fluid.  There were some aneurysmal changes.   The fistula is working well and no issues with alarming while on HD.  He has not had any scabs.  He has had some bleeding after dialysis.  He denies any pain in the left hand.   The pt is right hand dominant.   The pt is on a statin for cholesterol management.  The pt is not on a daily aspirin.  Other AC:  none The pt is on ARB for hypertension.  The pt is not on medication for diabetes.   Tobacco hx:  never  Past Medical History:  Diagnosis Date   Chronic kidney disease    ESRD (end stage renal disease) on dialysis (HCC)    Hyperlipidemia    Hypertension    Stroke (HCC)    Systolic heart failure [facility])     Past Surgical History:  Procedure Laterality Date   A/V FISTULAGRAM N/A 10/10/2023   Procedure: A/V Fistulagram;  Surgeon: Melia Lynwood ORN, MD;  Location: [facility] INVASIVE CV LAB;  Service: Cardiovascular;  Laterality: N/A;   AV FISTULA PLACEMENT Left 02/04/2022   Procedure: LEFT BRACHIOCEPHALIC VEIN FISTULA CREATION;  Surgeon: Magda Debby SAILOR, MD;  Location: [facility] OR;  Service: Vascular;  Laterality: Left;  PERIPHERAL NERVE BLOCK   COLONOSCOPY WITH PROPOFOL  N/A 07/07/2023   Procedure: COLONOSCOPY WITH PROPOFOL ;  Surgeon: Charlanne Groom, MD;  Location: [facility] ENDOSCOPY;  Service: Gastroenterology;   Laterality: N/A;   IR FLUORO GUIDE CV LINE RIGHT  12/17/2021   PERIPHERAL VASCULAR BALLOON ANGIOPLASTY Left 10/10/2023   Procedure: PERIPHERAL VASCULAR BALLOON ANGIOPLASTY;  Surgeon: Melia Lynwood ORN, MD;  Location: [facility] INVASIVE CV LAB;  Service: Cardiovascular;  Laterality: Left;  lt arm fistula   POLYPECTOMY  07/07/2023   Procedure: POLYPECTOMY;  Surgeon: Charlanne Groom, MD;  Location: [facility] ENDOSCOPY;  Service: Gastroenterology;;    No Known Allergies  Current Outpatient Medications  Medication Sig Dispense Refill   atorvastatin (LIPITOR) 10 MG tablet Take 10 mg by mouth daily.     B Complex-C-Folic Acid (DIALYVITE 800) 0.8 MG TABS Take 1 tablet by mouth daily.     cephALEXin  (KEFLEX ) 500 MG capsule Take 1 capsule (500 mg total) by mouth 4 (four) times daily. 20 capsule 0   losartan (COZAAR) 100 MG tablet Take 100 mg by mouth daily.     sucroferric oxyhydroxide (VELPHORO) 500 MG chewable tablet Chew 1,000 mg by mouth 3 (three) times daily with meals.     No current facility-administered medications for this visit.    Family History  Problem Relation Age of Onset   Diabetes Mother    Thyroid cancer Father    Hypertension Father    Hyperlipidemia Father  Stroke Brother    Diabetes Maternal Grandmother    Heart attack Maternal Grandfather    Brain cancer Paternal Grandfather    Diabetes Maternal Aunt     Social History   Socioeconomic History   Marital status: Single    Spouse name: Not on file   Number of children: 0   Years of education: Not on file   Highest education level: Not on file  Occupational History   Occupation: disabled  Tobacco Use   Smoking status: Never   Smokeless tobacco: Never  Vaping Use   Vaping status: Never Used  Substance and Sexual Activity   Alcohol use: Not Currently   Drug use: Not Currently   Sexual activity: Not on file  Other Topics Concern   Not on file  Social History Narrative   Not on file   Social Drivers of Health   Financial  Resource Strain: Medium Risk (09/12/2021)   Received from [employer] [facility]   Overall Financial Resource Strain (CARDIA)    Difficulty of Paying Living Expenses: Somewhat hard  Food Insecurity: No Food Insecurity (09/12/2021)   Received from [employer] [facility] Vital Sign    Within the past 12 months, you worried that your food would run out before you got the money to buy more.: Never true    Within the past 12 months, the food you bought just didn't last and you didn't have money to get more.: Never true  Transportation Needs: No Transportation Needs (09/12/2021)   Received from [facility] - Transportation    Lack of Transportation (Medical): No    Lack of Transportation (Non-Medical): No  Physical Activity: Inactive (09/12/2021)   Received from [facility]   Exercise Vital Sign    On average, how many days per week do you engage in moderate to strenuous exercise (like a brisk walk)?: 0 days    On average, how many minutes do you engage in exercise at this level?: 0 min  Stress: No Stress Concern Present (09/12/2021)   Received from [facility] - Occupational Stress Questionnaire    Feeling of Stress : Not at all  Social Connections: Unknown (06/04/2023)   Received from [facility]   Social Network    Social Network: Not on file  Intimate Partner Violence: Unknown (06/04/2023)   Received from [facility]   HITS    Physically Hurt: Not on file    Insult or Talk Down To: Not on file    Threaten Physical Harm: Not on file    Scream or Curse: Not on file     ROS: [x]  Positive   [ ]  Negative   [ ]  All sytems reviewed and are negative  Cardiac: []  chest pain/pressure []  SOB/DOE  Vascular: []  pain in legs while walking []  pain in feet when lying flat []  swelling in legs  Pulmonary: []  asthma []  wheezing  Neurologic: []  hx CVA/TIA  Hematologic: []  bleeding problems  GI []   GERD  GU: [x]  CKD/renal failure  [x]  HD---[]  M/W/F [x]  T/T/S  Psychiatric: []  hx of major depression  Integumentary: []  rashes []  ulcers  Constitutional: []  fever []  chills   PHYSICAL EXAMINATION:  Today's Vitals   06/24/24 1311  BP: 118/76  Pulse: (!) 109  Temp: 97.8 F (36.6 C)  TempSrc: Temporal  Weight: 208 lb 14.4 oz (94.8 kg)  PainSc: 0-No pain   Body mass index is 29.97 kg/m.  General:  WDWN male in NAD Gait: Not observed HENT: WNL Pulmonary: normal non-labored breathing  Cardiac: regular Skin: without rashes Vascular Exam/Pulses:   Extremities:  palpable left radial pulse.  Aneurysmal area distally.  There are no scabs and the skin moves freely over the fistula and is not adhered to the fistula.  Musculoskeletal: no muscle wasting or atrophy  Neurologic: A&O X 3  Non-Invasive Vascular Imaging:   Dialysis duplex on 06/24/2024: +--------------------+----------+-----------------+--------+  AVF                PSV (cm/s)Flow Vol (mL/min)Comments  +--------------------+----------+-----------------+--------+  Native artery inflow                1551                 +--------------------+----------+-----------------+--------+  AVF Anastomosis        395                               +--------------------+----------+-----------------+--------+     +------------+----------+-------------+----------+--------+  OUTFLOW VEINPSV (cm/s)Diameter (cm)Depth (cm)Describe  +------------+----------+-------------+----------+--------+  Prox UA        119        0.83        0.69             +------------+----------+-------------+----------+--------+  Mid UA         177        0.84        0.47             +------------+----------+-------------+----------+--------+  Dist UA        168        2.36        0.20             +------------+----------+-------------+----------+--------+  AC Fossa        95        3.67        0.18              +------------+----------+-------------+----------+--------+  Prox Forearm   146        1.04        0.12             +------------+----------+-------------+----------+--------+     ASSESSMENT/PLAN: 58 y.o. male with ESRD here for evaluation of his hemodialysis access with hx of  left BC AVF 02/04/2022 by Dr. Magda.  He had a fistulogram by Dr. Melia 10/10/2023 with angioplasty of the cephalic vein and the central veins were widely patent.   -the fistula is patent on duplex with flow volume of 1.5L/min -he does not have any scabbing on the fistula or thinning skin.  The skin over the fistula moves freely over the fistula and not adherent.  The fistula is working well on HD and the aneurysmal area is not bothersome to the pt.   -discussed would not do any intervention at this time but if he developed an ulceration or the fistula becomes dysfunctional, or the aneurysm further enlarges,  we can proceed with intervention.  They will call if he develops any ulcerations or any issues.  -discussed with pt that access does not last forever and will need intervention or even new access at some point.   He and his father expressed good understanding.     Lucie Apt, [facility] [phone]  Clinic MD:   [place]
